# Patient Record
Sex: Female | Born: 1990 | Hispanic: Yes | Marital: Single | Smoking: Former smoker
Health system: Southern US, Community
[De-identification: ages and names within clinical notes are randomized; demographics above are authoritative.]

## PROBLEM LIST (undated history)

## (undated) ENCOUNTER — Inpatient Hospital Stay (HOSPITAL_COMMUNITY): Payer: Self-pay

## (undated) DIAGNOSIS — N200 Calculus of kidney: Secondary | ICD-10-CM

## (undated) DIAGNOSIS — O139 Gestational [pregnancy-induced] hypertension without significant proteinuria, unspecified trimester: Secondary | ICD-10-CM

## (undated) DIAGNOSIS — K219 Gastro-esophageal reflux disease without esophagitis: Secondary | ICD-10-CM

## (undated) DIAGNOSIS — O24419 Gestational diabetes mellitus in pregnancy, unspecified control: Secondary | ICD-10-CM

## (undated) DIAGNOSIS — D649 Anemia, unspecified: Secondary | ICD-10-CM

## (undated) HISTORY — DX: Gastro-esophageal reflux disease without esophagitis: K21.9

## (undated) HISTORY — DX: Gestational diabetes mellitus in pregnancy, unspecified control: O24.419

## (undated) HISTORY — PX: CHOLECYSTECTOMY: SHX55

## (undated) HISTORY — PX: KIDNEY STONE SURGERY: SHX686

---
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
## 2008-02-28 ENCOUNTER — Emergency Department (HOSPITAL_COMMUNITY): Admission: EM | Admit: 2008-02-28 | Discharge: 2008-02-28 | Payer: Self-pay | Admitting: Emergency Medicine

## 2008-08-10 ENCOUNTER — Emergency Department (HOSPITAL_COMMUNITY): Admission: EM | Admit: 2008-08-10 | Discharge: 2008-08-10 | Payer: Self-pay | Admitting: Emergency Medicine

## 2009-11-17 ENCOUNTER — Emergency Department (HOSPITAL_COMMUNITY): Admission: EM | Admit: 2009-11-17 | Discharge: 2009-11-17 | Payer: Self-pay | Admitting: Emergency Medicine

## 2010-07-26 ENCOUNTER — Encounter: Payer: Self-pay | Admitting: Obstetrics & Gynecology

## 2010-08-19 LAB — RUBELLA ANTIBODY, IGM: Rubella: IMMUNE

## 2010-08-19 LAB — ABO/RH

## 2010-08-19 LAB — HEPATITIS B SURFACE ANTIGEN: Hepatitis B Surface Ag: NEGATIVE

## 2010-08-19 LAB — HIV ANTIBODY (ROUTINE TESTING W REFLEX): HIV: NONREACTIVE

## 2010-10-19 LAB — COMPREHENSIVE METABOLIC PANEL
AST: 35 U/L (ref 0–37)
Alkaline Phosphatase: 116 U/L (ref 39–117)
CO2: 25 mEq/L (ref 19–32)
Calcium: 9 mg/dL (ref 8.4–10.5)
Chloride: 105 mEq/L (ref 96–112)
Creatinine, Ser: 0.52 mg/dL (ref 0.4–1.2)
GFR calc Af Amer: >60 mL/min (ref >60)
Sodium: 138 mEq/L (ref 135–145)
Total Bilirubin: 0.7 mg/dL (ref 0.3–1.2)

## 2010-10-19 LAB — CBC: MCV: 77.6 fL — ABNORMAL LOW (ref 78.0–100.0)

## 2010-10-19 LAB — URINALYSIS, ROUTINE W REFLEX MICROSCOPIC
Bilirubin Urine: NEGATIVE
Glucose, UA: NEGATIVE mg/dL
Ketones, ur: NEGATIVE mg/dL
Urobilinogen, UA: 0.2 mg/dL (ref 0.0–1.0)

## 2010-10-19 LAB — URINE MICROSCOPIC-ADD ON

## 2010-10-19 LAB — DIFFERENTIAL
Lymphocytes Relative: 25 % (ref 12–46)
Monocytes Relative: 7 % (ref 3–12)

## 2010-10-19 LAB — URINE CULTURE

## 2010-10-19 LAB — LIPASE, BLOOD: Lipase: 22 U/L (ref 11–59)

## 2010-11-16 NOTE — Discharge Summary (Signed)
Kara Booker, LIGHTLE              ACCOUNT NO.:  [id number]   MEDICAL RECORD NO.:  [id number]          PATIENT TYPE:  INP   LOCATION:  9122                          FACILITY:  [facility]   PHYSICIAN:  Charles A. Clearance Coots, M.D.DATE OF BIRTH:  09/14/1990   DATE OF ADMISSION:  10/22/2007  DATE OF DISCHARGE:  10/27/2007                               DISCHARGE SUMMARY   ADMITTING DIAGNOSES:  1. 39-weeks gestation.  2. Pregnancy-induced hypertension.  3. Active labor.   DISCHARGE DIAGNOSES:  1. 39-weeks gestation.  2. Pregnancy-induced hypertension.  3. Active labor.  4. Status post primary low-transverse cesarean section for arrest of      dilatation.  5. Active phase of labor.  6. Chorioamnionitis.   Viable female delivered on October 24, 2007.  Apgars of 9 at 1 minutes, 9 at  5 minutes, weight of 3695 grams, length of 52.71 cm.  Mother and infant  discharged home in good condition.   REASON FOR ADMISSION:  An 20 year old G2, P0 estimated date of  confinement of October 29, 2007, presents with mild pregnancy-induced  hypertension and active labor.   PAST MEDICAL HISTORY:   SURGERY:  None.   ILLNESSES:  Cholelithiasis.   MEDICATIONS:  Prenatal vitamins.   ALLERGIES:  No known drug allergies.   SOCIAL HISTORY:  Single.  Negative history of tobacco, alcohol, or  recreational drug use.   PHYSICAL EXAMINATION:  GENERAL:  Well-nourished, well-developed female  in no acute distress, and afebrile.  VITAL SIGNS:  Blood pressure 120-140/70-90.  LUNGS:  Clear to auscultation bilaterally.  HEART:  Regular rate and rhythm.  ABDOMEN:  Gravid and nontender.  CERVIX:  2 cm 60% vertex at a -3 stage.  LOWER EXTREMITIES:  With 4+ pretibial edema bilaterally.  Deep tendon  reflexes are 2+.   ADMITTING LABORATORY VALUES:  Hemoglobin 11.9, hematocrit 34.6, white  blood cell count 10,500, and platelets 241,000.  Comprehensive metabolic  panel was within normal limits.  RPR was nonreactive.   HOSPITAL COURSE:  The patient was admitted and membranes were ruptured,  clear fluid.  Pitocin augmentation was started.  An intrauterine  pressure catheter was inserted.  The patient progressed to anterior rim,  cervix was 80% effaced with the vertex was at -2 station and had  increased temperature of 201 with uterine tenderness.  She made no  further descent of the vertex greater than 3 hours and a decision was  made to proceed with the cesarean section delivery for arrest of  dilatation and descent and chorioamnionitis.  Primary low transverse  cesarean section was performed on October 24, 2007.  There were no  intraoperative complications.  Postoperative course was uncomplicated.  The patient was discharged home on postop day #3 in good condition.   DISCHARGE LABS:  Hemoglobin 10, hematocrit 30, white blood cell count  20,000, and platelets 185,000.   DISCHARGE DISPOSITION:   MEDICATIONS:  1. Tylox and ibuprofen was prescribed for pain.  2. Continue prenatal vitamins.   Routine written instructions were given for discharge after cesarean  section.  The patient is to call the office for a followup  appointment  in 2 weeks.      Charles A. Clearance Coots, M.D.  Electronically Signed     CAH/MEDQ  D:  10/27/2007  T:  10/27/2007  Job:  [id number]

## 2010-11-16 NOTE — H&P (Signed)
NAMEBLONDIE, Kara Booker              ACCOUNT NO.:  [id number]   MEDICAL RECORD NO.:  [id number]          PATIENT TYPE:  INP   LOCATION:  9169                          FACILITY:  [facility]   PHYSICIAN:  Roseanna Rainbow, M.D.DATE OF BIRTH:  Jul 12, 1990   DATE OF ADMISSION:  10/22/2007  DATE OF DISCHARGE:                              HISTORY & PHYSICAL   CHIEF COMPLAINT:  The patient is an 20 year old, gravida 2, para 0, with  estimated date of confinement of October 29, 2007, with an intrauterine  pregnancy at 39 weeks with mild PIH, for induction labor.   INTRAUTERINE PREGNANCY ILLNESS:  Please see the above.   ALLERGIES:  No known drug allergies.   MEDICATIONS:  Prenatal vitamins.   OBSTETRICAL RISK FACTORS:  Adolescence, please see the above.   PRENATAL LABORATORY DATA:  Chlamydia probe negative, urine culture and  sensitivity no significant growth, quad screen negative, GC probe  negative, one-hour GTT 106, hepatitis C surface antigen negative,  hematocrit 34.4, cystic fibrosis carrier testing negative, blood type is  O-positive, Rubella immune, sickle cell negative, GBS negative on March  25th.   PAST GYNECOLOGIC HISTORY:  Noncontributory.   PAST MEDICAL HISTORY:  Cholelithiasis.   SOCIAL HISTORY:  She is single, has no significant smoking history,  denies illicit drug use.   PAST OBSTETRICAL HISTORY:  History of a spontaneous abortion.   PHYSICAL EXAMINATION:  VITAL SIGNS:  Blood pressure is 120 to 140/70s to  90s, fetal heart tracing reassuring; tocodynamometer, regular uterine  contractions.  LOWER EXTREMITIES:  Pretibial edema at 4+ bilaterally.   ASSESSMENT:  1. A primigravida at term.  2. Mild pregnancy-induced hypertension.   PLAN:  1. Admission.  2. Induction of labor.      Roseanna Rainbow, M.D.  Electronically Signed     LAJ/MEDQ  D:  10/23/2007  T:  10/23/2007  Job:  [id number]

## 2010-11-16 NOTE — Op Note (Signed)
Kara Booker, Kara Booker              ACCOUNT NO.:  [id number]   MEDICAL RECORD NO.:  [id number]          PATIENT TYPE:  INP   LOCATION:  9169                          [facility]ACILITY:  [facility]   PHYSICIAN:  Roseanna Rainbow, M.D.DATE OF BIRTH:  Dec 04, 1990   DATE OF PROCEDURE:  10/24/2007  DATE OF DISCHARGE:                               OPERATIVE REPORT   PREOPERATIVE DIAGNOSES:  1. Arrest of dilatation, active phase of labor.  2. Chorioamnionitis.  3. Intrauterine pregnancy at term.  4. Mild pregnancy-induced hypertension.   POSTOPERATIVE DIAGNOSES:  1. Arrest of dilatation, active phase of labor.  2. Chorioamnionitis.  3. Intrauterine pregnancy at term.  4. Mild pregnancy-induced hypertension.  5. Persistent occiput posterior.   PROCEDURE:  Primary low uterine flap elliptical cesarean delivery via  Pfannenstiel skin incision.   SURGEONS:  Roseanna Rainbow, M.D., and Bing Neighbors. Clearance Coots, M.D.   ANESTHESIA:  Epidural.   ESTIMATED BLOOD LOSS:  600 mL.   COMPLICATIONS:  Mild uterine atony.   IV FLUIDS/URINE OUTPUT:  As per anesthesiology.   DESCRIPTION OF PROCEDURE:  The patient was taken to the operating room  with an IV running and an epidural catheter in situ.  She was then  placed in the dorsal supine position with a left lateral tilt and  prepped and draped in the usual sterile fashion.  After a timeout had  been completed, a Pfannenstiel skin incision was then made with a  scalpel and carried down to the underlying fascia.  The fascia was then  nicked in the midline.  The fascial incision was then extended  bilaterally.  The superior aspect of the fascial incision was tented up  and the underlying rectus muscles dissected off.  The inferior aspect of  the fascial incision was manipulated in a similar fashion.  The rectus  muscles were separated in the midline.  The parietal peritoneum was then  entered bluntly and the incision was extended bluntly.  An Alexis  retractor  was then placed into the incision.  The vesicouterine  peritoneum was tented up and entered sharply.  This incision was then  extended bilaterally and the bladder flap created bluntly.  The lower  uterine segment was then incised in the transverse fashion with the  scalpel.  This incision was then extended bluntly.  The infant's head  was delivered atraumatically.  The oropharynx was suctioned with bulb  suction.  The cord was clamped and cut.  The infant was handed off to  the awaiting neonatologist.  The placenta was then removed.  The  intrauterine cavity was evacuated of any remaining amniotic fluid,  clots, and debris with moist laparotomy sponge.  At this point, the  uterus was exteriorized.  The uterine fundus was felt to be boggy.  The  uterine tone responded to intramyometrial Pitocin as well as massage.  The uterine incision was then reapproximated in a running interlocking  fashion using 0-Monocryl.  A second imbricating layer of the same was  then placed.  Adequate hemostasis was noted.  The uterus was returned to  the abdomen.  The paracolic gutters were  irrigated.  The retractor was  then removed.  The parietal peritoneum was reapproximated in a running  fashion using 2-0 Vicryl.  The fascia was closed in a running fashion  using 0-PDS.  The skin was closed with staples.  At the close of the  procedure, the instrument and pack counts were said to be correct x2.  The patient was taken to the PACU, awake and in stable condition.      Roseanna Rainbow, M.D.  Electronically Signed     LAJ/MEDQ  D:  10/24/2007  T:  10/24/2007  Job:  [id number]

## 2010-11-19 NOTE — Discharge Summary (Signed)
Kara Booker, Kara Booker              ACCOUNT NO.:  [id number]   MEDICAL RECORD NO.:  [id number]          PATIENT TYPE:  OIB   LOCATION:  6119                         FACILITY:  [facility]   PHYSICIAN:  Leonia Corona, M.D.  DATE OF BIRTH:  09/23/1990   DATE OF ADMISSION:  10/03/2005  DATE OF DISCHARGE:  10/04/2005                                 DISCHARGE SUMMARY   HISTORY OF PRESENT ILLNESS:  A 20 year old Hispanic female admitted for pain  control after elective cholecystectomy for cholelithiasis, diagnosed on  September 14, 2005. The abdominal ultrasound for right upper quadrant pain at  the emergency department. The patient diet was advanced as tolerated and  pain controlled with Tylenol #3. The patient was ambulating and voiding at  the time of discharge. Bowel sounds were positive.   OPERATIONS/PROCEDURES/TREATMENT:  Cholecystectomy on October 03, 2005.   DIAGNOSIS:  Cholelithiasis, symptomatic.   MEDICATIONS:  Vicodin 5/500 mg 1 tab p.o. q.4 to 6 hours.   DISCHARGE WEIGHT:  186 pounds.   CONDITION ON DISCHARGE:  Stable.   SPECIAL INSTRUCTIONS:  The patient received a handout from Dr. Leeanne Mannan.   FOLLOW UP:  1.  The patient will followup with [facility] at 2:30 on October 11, 2005.  2.  The patient will followup with Dr. Leeanne Mannan on October 13, 2005 at 4:00      p.m.      Angeline Slim, M.D.      Leonia Corona, M.D.  Electronically Signed    AL/MEDQ  D:  10/04/2005  T:  10/05/2005  Job:  [id number]

## 2010-11-19 NOTE — Op Note (Signed)
NAMEMALARY, Kara Booker              ACCOUNT NO.:  [id number]   MEDICAL RECORD NO.:  [id number]          PATIENT TYPE:  OIB   LOCATION:  6119                         FACILITY:  [facility]   PHYSICIAN:  Leonia Corona, M.D.  DATE OF BIRTH:  03-Feb-1991   DATE OF PROCEDURE:  10/03/2005  DATE OF DISCHARGE:  10/04/2005                                 OPERATIVE REPORT   PREOPERATIVE DIAGNOSIS:  Cholelithiasis with a history of gallstone colic.   POSTOPERATIVE DIAGNOSIS:  Cholelithiasis with a history of gallstone colic.   PROCEDURE:  Laparoscopic cholecystectomy.   ANESTHESIA:  General endotracheal tube anesthesia.   SURGEON:  Leonia Corona, M.D.   ASSISTANTDonnella Bi D. Pendse, M.D.   INDICATIONS FOR PROCEDURE:  This 20 year old female child was evaluated for  right upper quadrant abdominal pain.  Clinically suspicion of gallstone  colic was made.  The presence of gallstones was confirmed on ultrasonogram  with a normal liver functions without any evidence of bilateral obstruction.  Hence the indication for the procedure.   DESCRIPTION OF PROCEDURE:  The patient was brought into the operating room  and placed supine on the operating table.  General endotracheal tube  anesthesia was given.  The abdomen was cleaned, prepped and draped from  nipple to the pubis and one side laterally to the other.  An infraumbilical  curvilinear skin crease incision was made and deepened through the  subcutaneous tissue until the rectus sheath was reached which was held  between two clamps and divided in between to gain access in the peritoneum.  Once the peritoneum was visualized and opened, a right index finger was  introduced to confirm access into the peritoneum.  The finger was swept  around to break any fatty adhesions.  A stay suture taking 0 Vicryl was  placed in U fashion.  The Hassan cannula 10/12 mm was introduced under  direct vision in the peritoneal cavity and connected to the CO2  insufflation  and the stay suture was tied around the [place] cannula to hold it in  position.  Desired pressure of pneumoperitoneum was obtained with CO2  insufflation.  The laparoscopic camera was introduced through the [facility]  cannula into the abdominal cavity and preliminary survey was done.  The  gallbladder was found to be clearly visible, however, appeared to be  intrahepatic in most parts.  The second 10/12 mm port was placed in the  subxiphoid area in the midline for which a transverse 1.5 cm incision was  made and the port was pushed through the abdominal wall under direct vision  of the camera from within the peritoneal cavity.  Third and fourth ports  were 5 mm which were placed in right upper quadrant in subcostal area 10 mm  apart from each other for which a small skin incision was made and the ports  were pushed with pressure under direct vision of the camera from within the  peritoneal cavity.  Two graspers were introduced through 5 mm ports, one  grasped the fundus of the gallbladder and pushed upwards and outwards to  lift the right lobe of the  liver and expose the gallbladder fossa.  The  second grasped the gallbladder near the infundibulum and retracted laterally  to open the Calot's' triangle.  The dissector was introduced through the  subxiphoid port and dissection was carried out.  There was a dense adhesion  of the fatty omentum over the gallbladder which was taken down carefully.  More vascular areas were cauterized while taking down the fatty adhesion.  Once the infundibulum and neck of the gallbladder was exposed, the  dissection was carried out to expose the cystic duct.  The stone was felt in  the infundibulum.  The cystic duct was carefully isolated on all sides by  blunt dissection with the dissector and then carefully dissecting in the  Calot's' triangle, there was a branch of the vessel was identified.  A very  small branch which was cauterized.  The main  cystic artery was then  carefully dissected and metal clips were applied, two proximal and one  distal, and then this vessel was divided in between.  The cystic duct, a 1  cm segment was already isolated.  Three clips were applied, two proximally  and one distally and then the cystic duct was divided in between the two.  Keeping a traction at the neck of the gallbladder using a spatula connected  to cautery, the gallbladder was carefully lifted off of its bed with the  cautery until the last bit of the attachment of the fundus of the  gallbladder remained with the liver.  This helped as a fulcrum to lift up  the liver  so that the  the gallbladder bed could be  inspected well. Few  small oozing spots were cauterized.  No active bleeding was noted.  The area  was irrigated with saline and suctioned out completely.  After completing  the inspection and showing no active bleeding anywhere in the area of  dissection, the last portion of the attachment of gallbladder to the liver  was divided with electrocautery.  The camera was then switched from the  umbilical port to the epigastric port and EndoCatch bag was introduced  through the umbilical port and gallbladder was fed into the EndoCatch bag  and removed along with the port.  The gallbladder was removed along with the  bag.  The Hassan cannula was reintroduced into the umbilical port and once  again, some irrigation was done and returning fluid was clear.  The stay  suture already placed in the umbilical port was then tied under direct  vision of the camera through the epigastric port ensuring complete fascial  closure of the umbilical incision.  At this point, both 5 mm ports were  removed under direct vision of the camera from within the peritoneal cavity  and finally, pneumoperitoneum was released and epigastric port was also  removed.  Approximately 20 mL of 0.25% Marcaine with epinephrine was infiltrated in and around all of the  incisions.  No fascial closure was done  in any other incision.  The skin was closed with skin staples.  The wound  was cleaned and dried.  A sterile gauze dressing was applied.  The patient  tolerated the procedure very well which was smooth and uneventful.  The  patient was later extubated and transported to the recovery room in good  stable condition.      Leonia Corona, M.D.  Electronically Signed     SF/MEDQ  D:  10/03/2005  T:  10/04/2005  Job:  [id number]   cc:  Claudia C. Lubertha South, M.D.  Fax: [phone]

## 2011-01-09 ENCOUNTER — Inpatient Hospital Stay (HOSPITAL_COMMUNITY): Admission: AD | Admit: 2011-01-09 | Payer: Medicaid Other | Source: Ambulatory Visit | Admitting: Obstetrics

## 2011-01-09 ENCOUNTER — Inpatient Hospital Stay (HOSPITAL_COMMUNITY)
Admission: AD | Admit: 2011-01-09 | Discharge: 2011-01-09 | Disposition: A | Discharge status: Home / Self Care | Payer: Medicaid Other | Source: Ambulatory Visit | Attending: Obstetrics | Admitting: Obstetrics

## 2011-01-09 ENCOUNTER — Encounter (HOSPITAL_COMMUNITY): Payer: Self-pay

## 2011-01-09 DIAGNOSIS — M549 Dorsalgia, unspecified: Secondary | ICD-10-CM | POA: Insufficient documentation

## 2011-01-09 DIAGNOSIS — R109 Unspecified abdominal pain: Secondary | ICD-10-CM | POA: Insufficient documentation

## 2011-01-09 HISTORY — DX: Calculus of kidney: N20.0

## 2011-01-09 LAB — URINALYSIS, ROUTINE W REFLEX MICROSCOPIC
Bilirubin Urine: NEGATIVE
Glucose, UA: NEGATIVE mg/dL
Ketones, ur: 15 mg/dL — AB
Nitrite: NEGATIVE
Protein, ur: NEGATIVE mg/dL
Specific Gravity, Urine: >1.03 — ABNORMAL HIGH (ref 1.005–1.030)

## 2011-01-09 LAB — URINE MICROSCOPIC-ADD ON

## 2011-01-09 NOTE — Progress Notes (Signed)
Patient reports having a lot of back pain and lower abdominal pain since yesterday, no vaginal bleeding, pt has history of preclampsia with previous pregnancy. G2P1 [facility] 01/12/11

## 2011-01-12 ENCOUNTER — Encounter (HOSPITAL_COMMUNITY): Payer: Self-pay | Admitting: *Deleted

## 2011-01-12 ENCOUNTER — Inpatient Hospital Stay (HOSPITAL_COMMUNITY)
Admission: AD | Admit: 2011-01-12 | Discharge: 2011-01-12 | Disposition: A | Discharge status: Home / Self Care | Payer: Medicaid Other | Source: Ambulatory Visit | Attending: Obstetrics | Admitting: Obstetrics

## 2011-01-12 DIAGNOSIS — O479 False labor, unspecified: Secondary | ICD-10-CM | POA: Insufficient documentation

## 2011-01-12 HISTORY — DX: Gestational (pregnancy-induced) hypertension without significant proteinuria, unspecified trimester: O13.9

## 2011-01-12 MED ORDER — OXYCODONE-ACETAMINOPHEN 5-325 MG PO TABS
2.0000 | ORAL_TABLET | Freq: Once | ORAL | Status: DC
  Filled 2011-01-12: qty 2

## 2011-01-12 NOTE — Initial Assessments (Signed)
Pt complaints of pain and pressure in the lower part of her abdomen"that I know are not contractions"

## 2011-01-12 NOTE — Progress Notes (Signed)
Pt states she was having regular contraction 7-10 then they stopped. This am started having constant pelvic pain and pressure. Pt reports good fetal movement, no leaking or bleeding.

## 2011-01-14 ENCOUNTER — Encounter (HOSPITAL_COMMUNITY): Payer: Self-pay | Admitting: Anesthesiology

## 2011-01-14 ENCOUNTER — Encounter (HOSPITAL_COMMUNITY): Payer: Self-pay | Admitting: *Deleted

## 2011-01-14 ENCOUNTER — Inpatient Hospital Stay (HOSPITAL_COMMUNITY): Payer: Medicaid Other | Admitting: Anesthesiology

## 2011-01-14 ENCOUNTER — Inpatient Hospital Stay (HOSPITAL_COMMUNITY)
Admission: AD | Admit: 2011-01-14 | Discharge: 2011-01-17 | DRG: 775 | Disposition: A | Discharge status: Home / Self Care | Payer: Medicaid Other | Source: Ambulatory Visit | Attending: Obstetrics | Admitting: Obstetrics

## 2011-01-14 DIAGNOSIS — R509 Fever, unspecified: Secondary | ICD-10-CM | POA: Diagnosis not present

## 2011-01-14 DIAGNOSIS — O34219 Maternal care for unspecified type scar from previous cesarean delivery: Secondary | ICD-10-CM | POA: Diagnosis present

## 2011-01-14 LAB — CBC
MCV: 78.5 fL (ref 78.0–100.0)
Platelets: 283 10*3/uL (ref 150–400)
RDW: 15.2 % (ref 11.5–15.5)
WBC: 12 10*3/uL — ABNORMAL HIGH (ref 4.0–10.5)

## 2011-01-14 LAB — RPR: RPR Ser Ql: NONREACTIVE

## 2011-01-14 MED ORDER — EPHEDRINE 5 MG/ML INJ
10.0000 mg | INTRAVENOUS | Status: DC | PRN
  Filled 2011-01-14: qty 4

## 2011-01-14 MED ORDER — OXYTOCIN 20 UNITS IN LACTATED RINGERS INFUSION - SIMPLE
125.0000 mL/h | Freq: Once | INTRAVENOUS | Status: DC
  Filled 2011-01-14: qty 1000

## 2011-01-14 MED ORDER — PHENYLEPHRINE 40 MCG/ML (10ML) SYRINGE FOR IV PUSH (FOR BLOOD PRESSURE SUPPORT)
80.0000 ug | PREFILLED_SYRINGE | INTRAVENOUS | Status: DC | PRN
  Filled 2011-01-14: qty 5

## 2011-01-14 MED ORDER — LACTATED RINGERS IV SOLN
INTRAVENOUS | Status: DC
  Administered 2011-01-14 – 2011-01-15 (×4): via INTRAVENOUS

## 2011-01-14 MED ORDER — NALBUPHINE SYRINGE 5 MG/0.5 ML
10.0000 mg | INJECTION | INTRAMUSCULAR | Status: DC
  Administered 2011-01-14: 10 mg via INTRAVENOUS
  Filled 2011-01-14 (×3): qty 1

## 2011-01-14 MED ORDER — FENTANYL 2.5 MCG/ML BUPIVACAINE 1/10 % EPIDURAL INFUSION (WH - ANES)
2.0000 mL/h | INTRAMUSCULAR | Status: DC
  Administered 2011-01-14 (×2): 8 mL/h via EPIDURAL
  Filled 2011-01-14: qty 60

## 2011-01-14 MED ORDER — OXYTOCIN 20 UNITS IN LACTATED RINGERS INFUSION - SIMPLE
6.0000 m[IU]/min | INTRAVENOUS | Status: DC
  Administered 2011-01-14: 6 m[IU]/min via INTRAVENOUS
  Administered 2011-01-14: 2 m[IU]/min via INTRAVENOUS

## 2011-01-14 MED ORDER — NALBUPHINE SYRINGE 5 MG/0.5 ML
10.0000 mg | INJECTION | INTRAMUSCULAR | Status: DC | PRN
  Administered 2011-01-14: 10 mg via INTRAVENOUS
  Filled 2011-01-14 (×2): qty 1

## 2011-01-14 MED ORDER — LACTATED RINGERS IV SOLN: 500.0000 mL | Freq: Once | INTRAVENOUS | Status: DC

## 2011-01-14 MED ORDER — FLEET ENEMA 7-19 GM/118ML RE ENEM: 1.0000 | ENEMA | RECTAL | Status: DC | PRN

## 2011-01-14 MED ORDER — IBUPROFEN 600 MG PO TABS: 600.0000 mg | ORAL_TABLET | Freq: Four times a day (QID) | ORAL | Status: DC | PRN

## 2011-01-14 MED ORDER — PHENYLEPHRINE 40 MCG/ML (10ML) SYRINGE FOR IV PUSH (FOR BLOOD PRESSURE SUPPORT): 80.0000 ug | PREFILLED_SYRINGE | INTRAVENOUS | Status: DC | PRN

## 2011-01-14 MED ORDER — EPHEDRINE 5 MG/ML INJ: 10.0000 mg | INTRAVENOUS | Status: DC | PRN

## 2011-01-14 MED ORDER — ACETAMINOPHEN 325 MG PO TABS
650.0000 mg | ORAL_TABLET | ORAL | Status: DC | PRN
  Administered 2011-01-15: 650 mg via ORAL
  Filled 2011-01-14: qty 2

## 2011-01-14 MED ORDER — LIDOCAINE HCL (PF) 1 % IJ SOLN
30.0000 mL | Freq: Once | INTRAMUSCULAR | Status: AC | PRN
  Filled 2011-01-14: qty 30

## 2011-01-14 MED ORDER — LACTATED RINGERS IV SOLN
500.0000 mL | Freq: Once | INTRAVENOUS | Status: AC
  Administered 2011-01-14: 500 mL via INTRAVENOUS

## 2011-01-14 MED ORDER — TERBUTALINE SULFATE 1 MG/ML IJ SOLN: 0.2500 mg | Freq: Once | INTRAMUSCULAR | Status: AC | PRN

## 2011-01-14 MED ORDER — DIPHENHYDRAMINE HCL 50 MG/ML IJ SOLN: 12.5000 mg | INTRAMUSCULAR | Status: DC | PRN

## 2011-01-14 MED ORDER — HYDROXYZINE HCL 50 MG/ML IM SOLN
50.0000 mg | Freq: Once | INTRAMUSCULAR | Status: AC
  Administered 2011-01-14: 50 mg via INTRAMUSCULAR
  Filled 2011-01-14: qty 1

## 2011-01-14 MED ORDER — CITRIC ACID-SODIUM CITRATE 334-500 MG/5ML PO SOLN: 30.0000 mL | ORAL | Status: DC | PRN

## 2011-01-14 MED ORDER — LIDOCAINE HCL 1.5 % IJ SOLN
INTRAMUSCULAR | Status: DC | PRN
  Administered 2011-01-14 (×2): 2 mL

## 2011-01-14 MED ORDER — NALBUPHINE SYRINGE 5 MG/0.5 ML
10.0000 mg | INJECTION | Freq: Once | INTRAMUSCULAR | Status: AC
  Administered 2011-01-14: 10 mg via INTRAMUSCULAR
  Filled 2011-01-14: qty 1

## 2011-01-14 MED ORDER — FENTANYL 2.5 MCG/ML BUPIVACAINE 1/10 % EPIDURAL INFUSION (WH - ANES)
2.0000 mL/h | INTRAMUSCULAR | Status: DC
  Filled 2011-01-14: qty 60

## 2011-01-14 MED ORDER — ONDANSETRON HCL 4 MG/2ML IJ SOLN: 4.0000 mg | Freq: Four times a day (QID) | INTRAMUSCULAR | Status: DC | PRN

## 2011-01-14 MED ORDER — OXYCODONE-ACETAMINOPHEN 5-325 MG PO TABS: 2.0000 | ORAL_TABLET | ORAL | Status: DC | PRN

## 2011-01-14 MED ORDER — LACTATED RINGERS IV SOLN
500.0000 mL | INTRAVENOUS | Status: AC | PRN
  Administered 2011-01-15: 1000 mL via INTRAVENOUS

## 2011-01-14 NOTE — Anesthesia Procedure Notes (Signed)
Epidural Patient location during procedure: OB Start time: 01/14/2011 6:27 PM  Staffing Anesthesiologist: Adelaida Reindel A. Performed by: anesthesiologist   Preanesthetic Checklist Completed: patient identified, site marked, surgical consent, pre-op evaluation, timeout performed, IV checked, risks and benefits discussed and monitors and equipment checked  Epidural Patient position: sitting Prep: site prepped and draped and DuraPrep Patient monitoring: continuous pulse ox and blood pressure Approach: midline Injection technique: LOR air  Needle Needle type: Tuohy  Needle gauge: 17 G Needle length: 9 cm Catheter type: closed end flexible Catheter size: 19 Gauge Test dose: negative and 1.5% lidocaine  Assessment Events: blood not aspirated, injection not painful, no injection resistance, negative IV test and no paresthesia  Additional Notes Patient informed of Wet Tap and possibility of PDPHA. Will start infusion at  Reduced rate.

## 2011-01-14 NOTE — Anesthesia Preprocedure Evaluation (Addendum)
Anesthesia Evaluation  Name, MR# and DOB Patient awake and Patient confused  General Assessment Comment  Reviewed: Allergy & Precautions, H&P  and Patient's Chart, lab work & pertinent test results  History of Anesthesia Complications (+) PONV  Airway Mallampati: III TM Distance: <3 FB Neck ROM: Full  Mouth opening: Limited Mouth Opening  Dental No notable dental hx (+) Teeth Intact   Pulmonaryneg pulmonary ROS    clear to auscultation  pulmonary exam normal   Cardiovascular Regular Normal   Neuro/PsychNegative Neurological ROS Negative Psych ROS  GI/Hepatic/Renal negative GI ROS, negative Liver ROS, and negative Renal ROS (+)       Endo/Other  Negative Endocrine ROS (+)   Abdominal Normal abdominal exam  (+)   Musculoskeletal negative musculoskeletal ROS (+)  Hematology negative hematology ROS (+)   Peds  Reproductive/Obstetrics (+) Pregnancy   Anesthesia Other Findings Probable Difficult Intubation            Anesthesia Physical Anesthesia Plan  ASA: III  Anesthesia Plan: Epidural   Post-op Pain Management:    Induction:   Airway Management Planned: Natural Airway  Additional Equipment:   Intra-op Plan:   Post-operative Plan:   Informed Consent: I have reviewed the patients History and Physical, chart, labs and discussed the procedure including the risks, benefits and alternatives for the proposed anesthesia with the patient or authorized representative who has indicated his/her understanding and acceptance.     Plan Discussed with: Anesthesiologist (AP)  Anesthesia Plan Comments: (Probable Difficult Intubation)       Anesthesia Quick Evaluation

## 2011-01-14 NOTE — Initial Assessments (Signed)
Pt armband verified with patient at the beginning of shift.

## 2011-01-14 NOTE — Progress Notes (Signed)
Pt reports contractions x 4 hours, some mucus discharge

## 2011-01-14 NOTE — Progress Notes (Signed)
Pt C/O uc's since 0200, some mucus discharge, no bleeding.

## 2011-01-14 NOTE — H&P (Signed)
Kara Booker is a 20 y.o. female presenting for uterine contractions.. Maternal Medical History:  Reason for admission: Reason for admission: contractions.  Contractions: Onset was 3-5 hours ago.   Frequency: regular.   Duration is approximately 60 seconds.   Perceived severity is strong.    Fetal activity: Perceived fetal activity is normal.   Last perceived fetal movement was within the past hour.    Prenatal complications: no prenatal complications   OB History    Grav Para Term Preterm Abortions TAB SAB Ect Mult Living   3 1 1  1  1   1      Past Medical History  Diagnosis Date  . Kidney stones   . Kidney stones   . Pregnancy induced hypertension    Past Surgical History  Procedure Date  . Kidney stone surgery   . Cesarean section    Family History: family history includes Cancer in her cousin; Diabetes in her maternal uncle and paternal uncle; and Hyperlipidemia in her father, maternal uncle, mother, and paternal uncle. Social History:  reports that she has never smoked. She does not have any smokeless tobacco history on file. She reports that she does not drink alcohol or use illicit drugs.  Review of Systems  Constitutional: Negative.   HENT: Negative.   Eyes: Negative.   Respiratory: Negative.   Cardiovascular: Negative.   Gastrointestinal: Negative.   Genitourinary: Negative.   Musculoskeletal: Negative.   Skin: Negative.   Neurological: Negative.   Endo/Heme/Allergies: Negative.   Psychiatric/Behavioral: Negative.     Dilation: 4 Effacement (%): 70 Station: -2 Exam by:: nurse Blood pressure 117/73, pulse 78, temperature 98.1 F (36.7 C), temperature source Oral, resp. rate 18, height 5\' 1"  (1.549 m), weight 102.513 kg (226 lb). Maternal Exam:  Uterine Assessment: Contraction strength is moderate.  Contraction duration is 60 seconds. Contraction frequency is regular.   Abdomen: Patient reports no abdominal tenderness. Surgical scars: low  transverse.   Fetal presentation: vertex  Introitus: Normal vulva. Normal vagina.  Ferning test: not done.  Nitrazine test: not done. Amniotic fluid character: not assessed.  Pelvis: adequate for delivery.   Cervix: Cervix evaluated by digital exam.     Physical Exam  Constitutional: She is oriented to person, place, and time. She appears well-developed and well-nourished.  HENT:  Head: Normocephalic and atraumatic.  Eyes: Conjunctivae and EOM are normal. Pupils are equal, round, and reactive to light.  Neck: Normal range of motion. Neck supple.  Cardiovascular: Normal rate and regular rhythm.   Respiratory: Effort normal and breath sounds normal.  GI: Soft.  Genitourinary: Vagina normal and uterus normal.  Musculoskeletal: Normal range of motion.  Neurological: She is alert and oriented to person, place, and time.  Skin: Skin is warm and dry.  Psychiatric: She has a normal mood and affect. Her behavior is normal. Judgment and thought content normal.    Prenatal labs: ABO, Rh:   Antibody: Negative (02/16 0000) Rubella:   RPR: Nonreactive (02/16 0000)  HBsAg: Negative (02/16 0000)  HIV: Non-reactive (02/16 0000)  GBS: Negative (06/07 0000)   Assessment/Plan: Term pregnancy.  Active labor.   Ebba Goll A 01/14/2011, 12:43 PM

## 2011-01-15 ENCOUNTER — Encounter (HOSPITAL_COMMUNITY): Payer: Self-pay

## 2011-01-15 DIAGNOSIS — R509 Fever, unspecified: Secondary | ICD-10-CM | POA: Diagnosis not present

## 2011-01-15 LAB — CBC
HCT: 31.7 % — ABNORMAL LOW (ref 36.0–46.0)
MCH: 25.9 pg — ABNORMAL LOW (ref 26.0–34.0)
MCV: 78.3 fL (ref 78.0–100.0)
Platelets: 269 10*3/uL (ref 150–400)
RBC: 4.05 MIL/uL (ref 3.87–5.11)

## 2011-01-15 MED ORDER — OXYCODONE-ACETAMINOPHEN 5-325 MG PO TABS
1.0000 | ORAL_TABLET | ORAL | Status: DC | PRN
  Administered 2011-01-15 – 2011-01-17 (×7): 2 via ORAL
  Filled 2011-01-15: qty 2
  Filled 2011-01-15: qty 1
  Filled 2011-01-15 (×4): qty 2
  Filled 2011-01-15: qty 1
  Filled 2011-01-15: qty 2

## 2011-01-15 MED ORDER — LANOLIN HYDROUS EX OINT: TOPICAL_OINTMENT | CUTANEOUS | Status: DC | PRN

## 2011-01-15 MED ORDER — METHYLERGONOVINE MALEATE 0.2 MG PO TABS: 0.2000 mg | ORAL_TABLET | ORAL | Status: DC | PRN

## 2011-01-15 MED ORDER — AMPICILLIN 500 MG PO CAPS
500.0000 mg | ORAL_CAPSULE | Freq: Four times a day (QID) | ORAL | Status: DC
  Administered 2011-01-15 – 2011-01-17 (×10): 500 mg via ORAL
  Filled 2011-01-15 (×14): qty 1

## 2011-01-15 MED ORDER — RHO D IMMUNE GLOBULIN 1500 UNIT/2ML IJ SOLN: 300.0000 ug | Freq: Once | INTRAMUSCULAR | Status: DC

## 2011-01-15 MED ORDER — SODIUM CHLORIDE 0.9 % IV SOLN: 250.0000 mL | INTRAVENOUS | Status: DC

## 2011-01-15 MED ORDER — SODIUM CHLORIDE 0.9 % IV SOLN
2.0000 g | Freq: Four times a day (QID) | INTRAVENOUS | Status: DC
  Filled 2011-01-15: qty 2000

## 2011-01-15 MED ORDER — SODIUM CHLORIDE 0.9 % IJ SOLN
3.0000 mL | Freq: Two times a day (BID) | INTRAMUSCULAR | Status: DC
  Administered 2011-01-15 (×2): 3 mL via INTRAVENOUS

## 2011-01-15 MED ORDER — TETANUS-DIPHTH-ACELL PERTUSSIS 5-2.5-18.5 LF-MCG/0.5 IM SUSP
0.5000 mL | Freq: Once | INTRAMUSCULAR | Status: AC
  Administered 2011-01-17: 0.5 mL via INTRAMUSCULAR
  Filled 2011-01-15: qty 0.5

## 2011-01-15 MED ORDER — IBUPROFEN 600 MG PO TABS
600.0000 mg | ORAL_TABLET | Freq: Four times a day (QID) | ORAL | Status: DC
  Administered 2011-01-15 – 2011-01-17 (×10): 600 mg via ORAL
  Filled 2011-01-15 (×10): qty 1

## 2011-01-15 MED ORDER — FERROUS SULFATE 325 (65 FE) MG PO TABS
325.0000 mg | ORAL_TABLET | Freq: Two times a day (BID) | ORAL | Status: DC
  Administered 2011-01-15 – 2011-01-17 (×4): 325 mg via ORAL
  Filled 2011-01-15 (×4): qty 1

## 2011-01-15 MED ORDER — BENZOCAINE-MENTHOL 20-0.5 % EX AERO
INHALATION_SPRAY | CUTANEOUS | Status: AC
  Filled 2011-01-15: qty 56

## 2011-01-15 MED ORDER — ONDANSETRON HCL 4 MG PO TABS: 4.0000 mg | ORAL_TABLET | ORAL | Status: DC | PRN

## 2011-01-15 MED ORDER — BENZOCAINE-MENTHOL 20-0.5 % EX AERO
1.0000 "application " | INHALATION_SPRAY | CUTANEOUS | Status: DC | PRN
  Administered 2011-01-15: 06:00:00 via TOPICAL

## 2011-01-15 MED ORDER — DIPHENHYDRAMINE HCL 25 MG PO CAPS: 25.0000 mg | ORAL_CAPSULE | Freq: Four times a day (QID) | ORAL | Status: DC | PRN

## 2011-01-15 MED ORDER — SODIUM CHLORIDE 0.9 % IJ SOLN: 3.0000 mL | INTRAMUSCULAR | Status: DC | PRN

## 2011-01-15 MED ORDER — WITCH HAZEL-GLYCERIN EX PADS: MEDICATED_PAD | CUTANEOUS | Status: DC | PRN

## 2011-01-15 MED ORDER — OXYTOCIN 20 UNITS IN LACTATED RINGERS INFUSION - SIMPLE: 125.0000 mL/h | INTRAVENOUS | Status: DC | PRN

## 2011-01-15 MED ORDER — AMPICILLIN 500 MG PO CAPS
500.0000 mg | ORAL_CAPSULE | Freq: Four times a day (QID) | ORAL | Status: DC
  Administered 2011-01-15: 500 mg via ORAL
  Filled 2011-01-15 (×3): qty 1

## 2011-01-15 MED ORDER — SIMETHICONE 80 MG PO CHEW: 80.0000 mg | CHEWABLE_TABLET | ORAL | Status: DC | PRN

## 2011-01-15 MED ORDER — SENNOSIDES-DOCUSATE SODIUM 8.6-50 MG PO TABS
1.0000 | ORAL_TABLET | Freq: Every day | ORAL | Status: DC
  Administered 2011-01-15 – 2011-01-16 (×2): 2 via ORAL

## 2011-01-15 MED ORDER — PRENATAL PLUS 27-1 MG PO TABS
1.0000 | ORAL_TABLET | Freq: Every day | ORAL | Status: DC
  Administered 2011-01-15 – 2011-01-17 (×3): 1 via ORAL
  Filled 2011-01-15 (×3): qty 1

## 2011-01-15 MED ORDER — ZOLPIDEM TARTRATE 5 MG PO TABS: 5.0000 mg | ORAL_TABLET | Freq: Every evening | ORAL | Status: DC | PRN

## 2011-01-15 MED ORDER — ONDANSETRON HCL 4 MG/2ML IJ SOLN: 4.0000 mg | INTRAMUSCULAR | Status: DC | PRN

## 2011-01-15 NOTE — Progress Notes (Signed)
BABY HAS NOT FED SINCE INITIAL FEEDING AFTER BIRTH.  WAKING TECHNIQUES DONE AND BABY PLACED SKIN TO SKIN.  NO FEEDING CUES FROM BABY.  INSTRUCTED MOTHER TO KEEP BABY SKIN TO SKIN, WATCH FOR FEEDING CUES AND ATTEMPT FEEDING EVERY HOUR UNTIL FEEDING WELL.  BREASTFEEDING CONSULTATION SERVICES INFORMATION GIVEN TO PT.  ENCOURAGED TO CALL WITH QUESTIONS AND CONCERNS.

## 2011-01-16 NOTE — Procedures (Signed)
Epidural Blood Patch Procedure  Pre-operative diagnosis: Post dural puncture-type headache  Post-operative diagnosis: Same  Procedure:  1)  Epidural blood patch at the L3-L4 level 2) Blood drawn from Right Antecubital Vein after prepping skin with DuraPrep  After risks and benefits were explained including bleeding, infection, worsening of the pain, damage to the area being injected, nerve damage, spinal cord damage, paralysis, meningitis, arachnoiditis, allergic reaction to medication and spinal headache, all questions were answered and a signed consent was obtained.    The patient was placed in the sitting position. The existing epidural catheter was used for injection of the blood. 20ml of whole blood was withdrawn from the Right AC vein using sterile technique. The blood was then injected via the existing epidural catheter. She reported discomfort on the left lower back and left hip with injection at 10ml. She reported relief of her neck pain and headache.  After the blood was injected the epidural catheter was removed and a sterile dressing was place over the area. She was then placed in the right lateral decubitus position. She tolerated the procedure well.   She was instructed to return to [facility] if her headache recurs, if she develops a fever, severe back pain or has any weakness or numbness of her lower extremities.

## 2011-01-16 NOTE — Progress Notes (Signed)
  Postpartum day one Vital signs normal Fundus firm Lochia moderate Legs negative No complaints

## 2011-01-16 NOTE — Progress Notes (Signed)
20 y/o H female who delivered early yesterday am. She had a lumbar epidural catheter place for analgesia for L & D. During insertion of the epidural a "wet tap" was obtained. The epidural catheter was left in place after she delivered. She has developed a headache and neck stiffness which is positional in nature somewhat relieved by supine position. I have discussed  With her the options of treatment of her PDPHA. At this time she wishes to proceed with a Lumbar Epidural Blood Patch via her existing Epidural catheter.

## 2011-01-16 NOTE — Progress Notes (Signed)
PATIENT STATES SHE GAVE FORMULA DURING NIGHT DUE TO DIFFICULT LATCH ON LEFT BREAST.  BABY JUST FED ON RIGHT BREAST AND BABY NOT INTERESTED IN FEEDING NOW.  DEBP SET UP AND INITIATED WITH COLOSTRUM EASILY EXPRESSED.  RN TO ASSIST WITH DROPPER FEEDING WHEN BABY READY.  ENCOURAGED TO CALL WITH QUESTIONS AND CONCERNS.

## 2011-01-17 ENCOUNTER — Encounter (HOSPITAL_COMMUNITY): Payer: Self-pay | Admitting: Obstetrics

## 2011-01-17 ENCOUNTER — Encounter (HOSPITAL_COMMUNITY): Payer: Medicaid Other

## 2011-01-17 MED ORDER — BENZOCAINE-MENTHOL 20-0.5 % EX AERO: 1.0000 "application " | INHALATION_SPRAY | CUTANEOUS | Status: DC | PRN

## 2011-01-17 MED ORDER — FERROUS SULFATE 325 (65 FE) MG PO TABS: 325.0000 mg | ORAL_TABLET | Freq: Two times a day (BID) | ORAL | Status: DC

## 2011-01-17 MED ORDER — ACETAMINOPHEN-CODEINE 300-30 MG PO TABS: 1.0000 | ORAL_TABLET | ORAL | Status: AC | PRN

## 2011-01-17 NOTE — Progress Notes (Signed)
Mother giving lots of bottles , encouraged to freq feed infant at breast every 2-3 hours. inst to maintain good deep latch with each feeding.

## 2011-01-17 NOTE — Discharge Summary (Signed)
Obstetric Discharge Summary Reason for Admission: onset of labor Prenatal Procedures: none Intrapartum Procedures: spontaneous vaginal delivery Postpartum Procedures: none Complications-Operative and Postpartum: none  Hemoglobin  Date Value Range Status  01/15/2011 10.5* 12.0-15.0 (g/dL) Final     HCT  Date Value Range Status  01/15/2011 31.7* 36.0-46.0 (%) Final    Discharge Diagnoses: Term Pregnancy-delivered  Discharge Information: Date: 01/17/2011 Activity: pelvic rest Diet: routine Medications: Tylenol #3 and Iron Condition: stable Instructions: refer to practice specific booklet Discharge to: home Follow-up Information    Follow up with MARSHALL,BERNARD A. Call in 6 weeks.   Contact information:   [address] [place] [address] [phone]          Newborn Data: Live born  Information for the patient's newborn:  Amaranta, Mehl [[id number]]  female ; ;  Home with mother.  MARSHALL,BERNARD A 01/17/2011, 3:30 AM

## 2011-01-17 NOTE — Progress Notes (Signed)
  Postpartum day #2 Vital signs normal Fundus firm Legs negative No complaints Patient to be discharged today on Tylenol No. 3 for pain

## 2011-01-18 NOTE — Anesthesia Postprocedure Evaluation (Signed)
  Anesthesia Post-op Note  Patient: Kara Booker  Procedure(s) Performed: * No procedures listed *  Patient Location: [facility] Room 141  Anesthesia Type: Epidural  Level of Consciousness: awake, alert  and oriented  Airway and Oxygen Therapy: Patient Spontanous Breathing  Post-op Pain: mild  Post-op Assessment: Post-op Vital signs reviewed, Patient's Cardiovascular Status Stable, Respiratory Function Stable, Patent Airway, No signs of Nausea or vomiting, Adequate PO intake, Pain level controlled, No residual numbness and No residual motor weakness  Post-op Vital Signs: Reviewed and stable  Complications: postural headache

## 2011-03-29 LAB — URINALYSIS, ROUTINE W REFLEX MICROSCOPIC
Glucose, UA: NEGATIVE
Leukocytes, UA: NEGATIVE
Protein, ur: 100 — AB
Specific Gravity, Urine: 1.025
pH: 6.5

## 2011-03-29 LAB — COMPREHENSIVE METABOLIC PANEL
ALT: 25
ALT: 22
AST: 37
Alkaline Phosphatase: 180 — ABNORMAL HIGH
Alkaline Phosphatase: 187 — ABNORMAL HIGH
BUN: 12
CO2: 21
CO2: 21
Calcium: 8.8
GFR calc Af Amer: >60
GFR calc non Af Amer: >60
GFR calc non Af Amer: >60
Glucose, Bld: 86
Potassium: 3.9
Potassium: 4.3
Sodium: 135
Sodium: 135
Total Protein: 6.3

## 2011-03-29 LAB — CBC
HCT: 34.6 — ABNORMAL LOW
HCT: 35.6 — ABNORMAL LOW
Hemoglobin: 12.2
Hemoglobin: 12.3
MCHC: 34.5
MCHC: 35.2
MCHC: 35.2
MCV: 79.4
Platelets: 240
Platelets: 241
RBC: 3.87
RBC: 4.4
RDW: 14.5
RDW: 15.4
RDW: 15.6 — ABNORMAL HIGH
RDW: 15.8 — ABNORMAL HIGH
WBC: 11.9 — ABNORMAL HIGH
WBC: 10.5
WBC: 10.5

## 2011-03-29 LAB — RPR: RPR Ser Ql: NONREACTIVE

## 2011-03-29 LAB — CREATININE CLEARANCE, URINE, 24 HOUR
Collection Interval-CRCL: 24
Creatinine Clearance: 111
Creatinine, 24H Ur: 812
Creatinine, Urine: 85.5
Creatinine: 0.51

## 2011-03-29 LAB — URINE MICROSCOPIC-ADD ON

## 2011-03-29 LAB — URIC ACID: Uric Acid, Serum: 5.4

## 2011-03-29 LAB — LACTATE DEHYDROGENASE: LDH: 159

## 2011-04-12 LAB — URINALYSIS, ROUTINE W REFLEX MICROSCOPIC
Bilirubin Urine: NEGATIVE
Glucose, UA: NEGATIVE
Ketones, ur: NEGATIVE
Protein, ur: NEGATIVE
Protein, ur: NEGATIVE
Specific Gravity, Urine: 1.025
Urobilinogen, UA: 0.2

## 2011-04-12 LAB — URINE MICROSCOPIC-ADD ON

## 2012-10-13 ENCOUNTER — Encounter (HOSPITAL_COMMUNITY): Payer: Self-pay | Admitting: *Deleted

## 2012-10-13 DIAGNOSIS — Z8742 Personal history of other diseases of the female genital tract: Secondary | ICD-10-CM | POA: Insufficient documentation

## 2012-10-13 DIAGNOSIS — F17200 Nicotine dependence, unspecified, uncomplicated: Secondary | ICD-10-CM | POA: Insufficient documentation

## 2012-10-13 DIAGNOSIS — Z87440 Personal history of urinary (tract) infections: Secondary | ICD-10-CM | POA: Insufficient documentation

## 2012-10-13 DIAGNOSIS — L02419 Cutaneous abscess of limb, unspecified: Secondary | ICD-10-CM | POA: Insufficient documentation

## 2012-10-13 DIAGNOSIS — L02219 Cutaneous abscess of trunk, unspecified: Secondary | ICD-10-CM | POA: Insufficient documentation

## 2012-10-13 NOTE — ED Notes (Signed)
Pt c/o boil to inner left thigh since Thursday, states it is not draining. Painful to walk.  Denies fevers/chills, n/v.

## 2012-10-14 ENCOUNTER — Emergency Department (HOSPITAL_COMMUNITY)
Admission: EM | Admit: 2012-10-14 | Discharge: 2012-10-14 | Disposition: A | Discharge status: Home / Self Care | Payer: Self-pay | Attending: Emergency Medicine | Admitting: Emergency Medicine

## 2012-10-14 DIAGNOSIS — L03119 Cellulitis of unspecified part of limb: Secondary | ICD-10-CM

## 2012-10-14 MED ORDER — HYDROCODONE-ACETAMINOPHEN 5-325 MG PO TABS: 1.0000 | ORAL_TABLET | ORAL | Status: DC | PRN

## 2012-10-14 MED ORDER — DOXYCYCLINE HYCLATE 100 MG PO CAPS: 100.0000 mg | ORAL_CAPSULE | Freq: Two times a day (BID) | ORAL | Status: DC

## 2012-10-14 NOTE — ED Provider Notes (Signed)
History     CSN: [id number]  Arrival date & time 10/13/12  2334   First MD Initiated Contact with Patient 10/14/12 0007      Chief Complaint  Patient presents with  . Abscess   HPI  History provided by the patient. Patient is a 22 year old female who presents with concerns of skin redness, pain and swelling. Patient first noticed a small area of redness and swelling to her left proximal thigh and groin area 2 days ago. She began using warm compresses over the area and was told by friends this was a boil. Since that time areas become more painful and sore. There has been no bleeding or drainage from the area. No erythematous streaks up the leg or body. No fever, chills or sweats. No other aggravating or alleviating factors. Denies similar symptoms previously.      Past Medical History  Diagnosis Date  . Kidney stones   . Kidney stones   . Pregnancy induced hypertension   . NVD (normal vaginal delivery) 01/17/2011    Past Surgical History  Procedure Laterality Date  . Kidney stone surgery    . Cesarean section      Family History  Problem Relation Age of Onset  . Hyperlipidemia Mother   . Hyperlipidemia Father   . Hyperlipidemia Maternal Uncle   . Diabetes Maternal Uncle   . Hyperlipidemia Paternal Uncle   . Diabetes Paternal Uncle   . Cancer Cousin     History  Substance Use Topics  . Smoking status: Current Every Day Smoker -- 0.10 packs/day  . Smokeless tobacco: Not on file  . Alcohol Use: Yes     Comment: occ    OB History   Grav Para Term Preterm Abortions TAB SAB Ect Mult Living   3 2 2  1  1   2       Review of Systems  Constitutional: Negative for fever, chills and diaphoresis.  All other systems reviewed and are negative.    Allergies  Review of patient's allergies indicates no known allergies.  Home Medications   Current Outpatient Rx  Name  Route  Sig  Dispense  Refill  . OVER THE COUNTER MEDICATION   Oral   Take 1 tablet by mouth  daily. O[facility]           BP 119/67  Pulse 97  Temp(Src) 98.2 F (36.8 C) (Oral)  Resp 16  SpO2 98%  Physical Exam  Nursing note and vitals reviewed. Constitutional: She is oriented to person, place, and time. She appears well-developed and well-nourished. No distress.  HENT:  Head: Normocephalic.  Cardiovascular: Normal rate and regular rhythm.   Pulmonary/Chest: Effort normal and breath sounds normal. No respiratory distress. She has no wheezes. She has no rales.  Abdominal: Soft.  Musculoskeletal: Normal range of motion.  Neurological: She is alert and oriented to person, place, and time.  Skin: Skin is warm and dry. No rash noted. There is erythema.  6 cm area of erythema with slight induration to the medial left proximal thigh and groin. There is a small central or slightly fluctuant nodular area approximately 2 cm in length.  Psychiatric: She has a normal mood and affect. Her behavior is normal.    ED Course  Procedures   INCISION AND DRAINAGE Performed by: Angus Seller Consent: Verbal consent obtained. Risks and benefits: risks, benefits and alternatives were discussed Type: abscess  Body area: Left proximal thigh and groin  Anesthesia: local  infiltration  Incision was made with a scalpel.  Local anesthetic: lidocaine 2% with epinephrine  Anesthetic total: 1 ml  Complexity: complex Blunt dissection to break up loculations  Drainage: purulent  Drainage amount: Small   Packing material: None   Patient tolerance: Patient tolerated the procedure well with no immediate complications.       1. Cellulitis and abscess of leg       MDM  12:15 AM patient seen and evaluated. Patient appears well in no acute distress. She does not appear severely ill or toxic.        Angus Seller, PA-C 10/14/12 [phone]

## 2012-10-14 NOTE — ED Provider Notes (Signed)
Medical screening examination/treatment/procedure(s) were performed by non-physician practitioner and as supervising physician I was immediately available for consultation/collaboration.  Sunnie Nielsen, MD 10/14/12 [phone]

## 2013-03-27 ENCOUNTER — Encounter: Payer: Self-pay | Admitting: Gynecology

## 2013-03-27 ENCOUNTER — Ambulatory Visit (INDEPENDENT_AMBULATORY_CARE_PROVIDER_SITE_OTHER): Payer: Self-pay | Admitting: Gynecology

## 2013-03-27 ENCOUNTER — Other Ambulatory Visit (HOSPITAL_COMMUNITY)
Admission: RE | Admit: 2013-03-27 | Discharge: 2013-03-27 | Disposition: A | Discharge status: Home / Self Care | Payer: Medicaid Other | Source: Ambulatory Visit | Attending: Gynecology | Admitting: Gynecology

## 2013-03-27 VITALS — BP 126/86 | Ht 60.75 in | Wt 218.0 lb

## 2013-03-27 DIAGNOSIS — Z01419 Encounter for gynecological examination (general) (routine) without abnormal findings: Secondary | ICD-10-CM | POA: Insufficient documentation

## 2013-03-27 DIAGNOSIS — L709 Acne, unspecified: Secondary | ICD-10-CM | POA: Insufficient documentation

## 2013-03-27 DIAGNOSIS — Z309 Encounter for contraceptive management, unspecified: Secondary | ICD-10-CM

## 2013-03-27 DIAGNOSIS — N915 Oligomenorrhea, unspecified: Secondary | ICD-10-CM

## 2013-03-27 DIAGNOSIS — Z8632 Personal history of gestational diabetes: Secondary | ICD-10-CM | POA: Insufficient documentation

## 2013-03-27 DIAGNOSIS — L708 Other acne: Secondary | ICD-10-CM

## 2013-03-27 DIAGNOSIS — E663 Overweight: Secondary | ICD-10-CM

## 2013-03-27 LAB — CBC WITH DIFFERENTIAL/PLATELET
Basophils Absolute: 0 10*3/uL (ref 0.0–0.1)
Eosinophils Absolute: 0.3 10*3/uL (ref 0.0–0.7)
Eosinophils Relative: 3 % (ref 0–5)
MCH: 26 pg (ref 26.0–34.0)
MCHC: 33.4 g/dL (ref 30.0–36.0)
MCV: 77.7 fL — ABNORMAL LOW (ref 78.0–100.0)
Platelets: 438 10*3/uL — ABNORMAL HIGH (ref 150–400)
RDW: 13.9 % (ref 11.5–15.5)

## 2013-03-27 LAB — PREGNANCY, URINE: Preg Test, Ur: NEGATIVE

## 2013-03-27 MED ORDER — NORETHIN ACE-ETH ESTRAD-FE 1-20 MG-MCG(24) PO TABS: 1.0000 | ORAL_TABLET | Freq: Every day | ORAL | Status: DC

## 2013-03-27 MED ORDER — MEDROXYPROGESTERONE ACETATE 10 MG PO TABS: 10.0000 mg | ORAL_TABLET | Freq: Every day | ORAL | Status: DC

## 2013-03-27 NOTE — Progress Notes (Signed)
Kara Booker [birth year] [id number]   History:    22 y.o.  for annual gyn exam new patient to the practice. The patient complaining of cycles being very irregular whereby sometimes she would go as much as 5 months without a menstrual cycle. She has been using condoms for contraception. She is a gravida 3 para 2 Ab1 (spontaneous miscarriage). Her first pregnancy was delivered via cesarean section the second one was a vaginal birth after cesarean section. Patient stated her last Pap smear was in 2012 and that she has always had normal Pap smears. Her last full gynecological exam at another medical facility was over 2 years ago. Patient denies any visual disturbances, headaches, or nipple discharge. Her last menstrual period was August 9 through the 16th. Patient's second pregnancy resulted in gestational diabetes and patient's mother is non-insulin-dependent diabetic.  Past medical history,surgical history, family history and social history were all reviewed and documented in the EPIC chart.  Gynecologic History Patient's last menstrual period was 02/09/2013. Contraception: condoms Last Pap: 2012. Results were: normal Last mammogram: not indicated. Results were: not indicated  Obstetric History OB History  Gravida Para Term Preterm AB SAB TAB Ectopic Multiple Living  3 2 2  1 1    2     # Outcome Date GA Lbr Len/2nd Weight Sex Delivery Anes PTL Lv  3 TRM 01/15/11 [redacted]w[redacted]d 19:08 / 03:52 7 lb 15.3 oz (3.609 kg) M VBAC EPI  Y     Comments: None  2 SAB           1 TRM     M LTCS   Y     Comments: failed induction due to HTN       ROS: A ROS was performed and pertinent positives and negatives are included in the history.  GENERAL: No fevers or chills. HEENT: No change in vision, no earache, sore throat or sinus congestion. NECK: No pain or stiffness. CARDIOVASCULAR: No chest pain or pressure. No palpitations. PULMONARY: No shortness of breath, cough or wheeze. GASTROINTESTINAL: No abdominal pain,  nausea, vomiting or diarrhea, melena or bright red blood per rectum. GENITOURINARY: No urinary frequency, urgency, hesitancy or dysuria. MUSCULOSKELETAL: No joint or muscle pain, no back pain, no recent trauma. DERMATOLOGIC: No rash, no itching, no lesions. ENDOCRINE: No polyuria, polydipsia, no heat or cold intolerance. No recent change in weight. HEMATOLOGICAL: No anemia or easy bruising or bleeding. NEUROLOGIC: No headache, seizures, numbness, tingling or weakness. PSYCHIATRIC: No depression, no loss of interest in normal activity or change in sleep pattern.     Exam: chaperone present  BP 126/86  Ht 5' 0.75" (1.543 m)  Wt 218 lb (98.884 kg)  BMI 41.53 kg/m2  LMP 02/09/2013  Body mass index is 41.53 kg/(m^2).  General appearance : Well developed well nourished female. No acute distress HEENT: Neck supple, trachea midline, no carotid bruits, no thyroidmegaly Lungs: Clear to auscultation, no rhonchi or wheezes, or rib retractions  Heart: Regular rate and rhythm, no murmurs or gallops Breast:Examined in sitting and supine position were symmetrical in appearance, no palpable masses or tenderness,  no skin retraction, no nipple inversion, no nipple discharge, no skin discoloration, no axillary or supraclavicular lymphadenopathy Abdomen: no palpable masses or tenderness, no rebound or guarding Extremities: no edema or skin discoloration or tenderness  Pelvic:  Bartholin, Urethra, Skene Glands: Within normal limits             Vagina: No gross lesions or discharge  Cervix: No gross lesions  or discharge  Uterus  anteverted, normal size, shape and consistency, non-tender and mobile  Adnexa  Without masses or tenderness  Anus and perineum  normal   Rectovaginal  normal sphincter tone without palpated masses or tenderness             Hemoccult that indicated     Assessment/Plan:  22 y.o. female for annual exam with history of oligomenorrhea. Patient is overweight with a BMI of 41.53 kg per  meter squared. We discussed possible etiologies for her oligomenorrhea to include obesity, possible PCO S., possible thyroid or pituitary dysfunction. The following labs ordered today: Urine pregnancy test today negative. Tests ordered: TSH, prolactin, CBC, urinalysis, hemoglobin A1c, screening cholesterol and Pap smear. Patient also suffers from acne as well and which she does have her cycles are very heavy. We discussed starting oral contraceptive pill at low dose. Patient denies any family history of any bleeding or clotting disorders. The patient is a nonsmoker. We discussed importance of regular exercise and proper nutrition for which handout was provided. She will take Provera 10 mg for the next 5-10 days to initiate her menses and then on the second day she was started on the oral contraceptive pill LoMedia. The risks benefits and pros and cons to include potential risk of DVT and pulmonary embolism from oral contraceptive pill usage was provided. Ligature information on all the above was provided. Patient was reminded also to do her monthly breast exams.    Ok Edwards MD, 1:44 PM 03/27/2013

## 2013-03-27 NOTE — Patient Instructions (Addendum)
Uso de los anticonceptivos orales  (Oral Contraception Use) Los anticonceptivos orales son medicamentos que se utilizan para evitar el embarazo. Su funcin es evitar que los ovarios liberen vulos. Las hormonas de los anticonceptivos orales hacen que el moco cervical se haga ms espeso, lo que evita que el esperma ingrese al tero. Tambin hacen que la membrana que tapiza el tero se vuelva ms fina, lo que no permite que el huevo fertilizado se adhiera a la pared del tero. Los anticonceptivos orales son muy efectivos cuando se toman exactamente como se prescriben. Sin embargo, los anticonceptivos orales no previenen contra las enfermedades de transmisin sexual (ETS). La prctica del sexo seguro, como el uso de preservativos, junto con los anticonceptivos orales, ayudan a prevenir ese tipo de enfermedades. Antes de tomar la pldora, usted debe hacerse un examen fsico y un [place]. El mdico podr indicarle anlisis de sangre, si es necesario. El mdico se asegurar de que usted es una buena candidata para usar anticonceptivos orales. Converse con su mdico acerca de los posibles efectos secundarios de los anticonceptivos orales. Cuando se inicia el uso de anticonceptivos orales, se pueden tomar durante 2 a 3 meses para que el cuerpo se adapte a los cambios en los niveles hormonales en el cuerpo.  CMO TOMAR LOS ANTICONCEPTIVOS ORALES  El mdico le indicar como comenzar a tomar el primer ciclo de anticonceptivos orales. De lo contrario usted puede:   Comenzar el 1er. da del ciclo menstrual. No necesitar proteccin anticonceptiva adicional al comenzar en este momento.  Comenzar el primer domingo luego de su perodo menstrual, o el da en que adquiere el medicamento. En estos casos deber tener proteccin anticonceptiva adicional durante los primeros 7 das del ciclo. Luego de comenzar a tomar los anticonceptivos orales:   Si olvid de tomar 1 pldora, tmela tan pronto como lo recuerde. Tome la  siguiente pldora a la hora habitual.  Si olvida tomar 2  ms pldoras, utilice un mtodo anticonceptivo adicional hasta que comience su prximo perodo menstrual.  Si utiliza el envase de 28 pldoras y olvida tomar 1 de las ltimas 7 (pldoras sin hormonas), sto no tiene importancia. Simplemente deseche el resto de las pldoras que no contienen hormonas y comience un nuevo envase. No importa cuando comience a tomar los anticonceptivos, siempre empiece un nuevo envase el mismo da de la semana. Tenga un envase extra de pldoras anticonceptivas y use un mtodo anticonceptivo adicional para el caso en que se olvide de tomar algunas pldoras o pierda la caja.  INSTRUCCIONES PARA EL CUIDADO DOMICILIARIO  No fume.  Siempre use un condn para protegerse de las enfermedades de transmisin sexual. Los anticonceptivos orales no protegen contra las enfermedades de transmisin sexual.  Marque en un calendario las fechas en las que tiene sus perodos menstruales.  Lea la informacin y consejos que vienen con las pldoras. Pngase en contacto con el mdico siempre que tenga preguntas. SOLICITE ATENCIN MDICA SI:  Presenta nuseas o vmitos.  Tiene flujo o sangrado vaginal anormal.  Aparece una erupcin cutnea.  No tiene el perodo menstrual.  Pierde el cabello.  Necesita tratamiento por cambios en su estado de nimo o por depresin.  Se siente mareada al tomar la pldora.  Comienza a aparecer acn con el uso de los anticonceptivos orales.  Queda embarazada. SOLICITE ATENCIN MDICA DE INMEDIATO SI:  Siente dolor en el pecho.  Le falta el aire.  Le duele mucho la cabeza y no puede controlar el dolor.  Siente adormecimiento o tiene   dificultad para hablar.  Tiene problemas de visin.  Presenta dolor, inflamacin o hinchazn en las piernas. Document Released: 06/09/2011 Document Revised: 09/12/2011 [employer] Patient Information 2014 [employer].  

## 2013-03-28 ENCOUNTER — Other Ambulatory Visit: Payer: Self-pay | Admitting: *Deleted

## 2013-03-28 DIAGNOSIS — R7989 Other specified abnormal findings of blood chemistry: Secondary | ICD-10-CM

## 2013-03-28 DIAGNOSIS — E7800 Pure hypercholesterolemia, unspecified: Secondary | ICD-10-CM

## 2013-03-28 DIAGNOSIS — R7309 Other abnormal glucose: Secondary | ICD-10-CM

## 2013-03-28 LAB — URINALYSIS W MICROSCOPIC + REFLEX CULTURE
Casts: NONE SEEN
Glucose, UA: NEGATIVE mg/dL
Nitrite: POSITIVE — AB
Protein, ur: NEGATIVE mg/dL
pH: 6 (ref 5.0–8.0)

## 2013-03-29 LAB — URINE CULTURE

## 2013-04-01 ENCOUNTER — Other Ambulatory Visit: Payer: Self-pay

## 2013-04-01 ENCOUNTER — Other Ambulatory Visit: Payer: Self-pay | Admitting: Gynecology

## 2013-04-01 DIAGNOSIS — R7989 Other specified abnormal findings of blood chemistry: Secondary | ICD-10-CM

## 2013-04-01 DIAGNOSIS — E7800 Pure hypercholesterolemia, unspecified: Secondary | ICD-10-CM

## 2013-04-01 DIAGNOSIS — R7309 Other abnormal glucose: Secondary | ICD-10-CM

## 2013-04-01 LAB — CBC WITH DIFFERENTIAL/PLATELET
Basophils Absolute: 0 10*3/uL (ref 0.0–0.1)
Basophils Relative: 0 % (ref 0–1)
Eosinophils Absolute: 0.2 10*3/uL (ref 0.0–0.7)
Eosinophils Relative: 2 % (ref 0–5)
HCT: 37.1 % (ref 36.0–46.0)
Lymphocytes Relative: 33 % (ref 12–46)
MCH: 25.7 pg — ABNORMAL LOW (ref 26.0–34.0)
MCHC: 33.2 g/dL (ref 30.0–36.0)
MCV: 77.6 fL — ABNORMAL LOW (ref 78.0–100.0)
Monocytes Absolute: 0.5 10*3/uL (ref 0.1–1.0)
Platelets: 435 10*3/uL — ABNORMAL HIGH (ref 150–400)
RDW: 14 % (ref 11.5–15.5)
WBC: 10.1 10*3/uL (ref 4.0–10.5)

## 2013-04-01 LAB — LIPID PANEL
HDL: 53 mg/dL (ref >39)
Total CHOL/HDL Ratio: 3.6 Ratio
Triglycerides: 133 mg/dL (ref <150)

## 2013-04-01 MED ORDER — NITROFURANTOIN MONOHYD MACRO 100 MG PO CAPS: 100.0000 mg | ORAL_CAPSULE | Freq: Two times a day (BID) | ORAL | Status: DC

## 2013-04-02 ENCOUNTER — Other Ambulatory Visit: Payer: Self-pay | Admitting: Gynecology

## 2013-04-02 DIAGNOSIS — D649 Anemia, unspecified: Secondary | ICD-10-CM

## 2014-01-17 ENCOUNTER — Encounter (HOSPITAL_COMMUNITY): Payer: Self-pay | Admitting: Emergency Medicine

## 2014-01-17 ENCOUNTER — Emergency Department (HOSPITAL_COMMUNITY)
Admission: EM | Admit: 2014-01-17 | Discharge: 2014-01-18 | Disposition: A | Discharge status: Home / Self Care | Payer: No Typology Code available for payment source | Attending: Emergency Medicine | Admitting: Emergency Medicine

## 2014-01-17 DIAGNOSIS — N390 Urinary tract infection, site not specified: Secondary | ICD-10-CM | POA: Insufficient documentation

## 2014-01-17 DIAGNOSIS — Z792 Long term (current) use of antibiotics: Secondary | ICD-10-CM | POA: Insufficient documentation

## 2014-01-17 DIAGNOSIS — R1013 Epigastric pain: Secondary | ICD-10-CM | POA: Insufficient documentation

## 2014-01-17 DIAGNOSIS — Z9889 Other specified postprocedural states: Secondary | ICD-10-CM | POA: Insufficient documentation

## 2014-01-17 DIAGNOSIS — Z79899 Other long term (current) drug therapy: Secondary | ICD-10-CM | POA: Insufficient documentation

## 2014-01-17 DIAGNOSIS — R110 Nausea: Secondary | ICD-10-CM | POA: Insufficient documentation

## 2014-01-17 DIAGNOSIS — Z87442 Personal history of urinary calculi: Secondary | ICD-10-CM | POA: Insufficient documentation

## 2014-01-17 DIAGNOSIS — R1012 Left upper quadrant pain: Secondary | ICD-10-CM | POA: Insufficient documentation

## 2014-01-17 DIAGNOSIS — R1011 Right upper quadrant pain: Secondary | ICD-10-CM | POA: Insufficient documentation

## 2014-01-17 DIAGNOSIS — IMO0002 Reserved for concepts with insufficient information to code with codable children: Secondary | ICD-10-CM | POA: Insufficient documentation

## 2014-01-17 DIAGNOSIS — Z3202 Encounter for pregnancy test, result negative: Secondary | ICD-10-CM | POA: Insufficient documentation

## 2014-01-17 DIAGNOSIS — K429 Umbilical hernia without obstruction or gangrene: Secondary | ICD-10-CM | POA: Insufficient documentation

## 2014-01-17 DIAGNOSIS — Z87891 Personal history of nicotine dependence: Secondary | ICD-10-CM | POA: Insufficient documentation

## 2014-01-17 LAB — URINALYSIS, ROUTINE W REFLEX MICROSCOPIC
BILIRUBIN URINE: NEGATIVE
Glucose, UA: NEGATIVE mg/dL
Ketones, ur: NEGATIVE mg/dL
Nitrite: POSITIVE — AB
Protein, ur: NEGATIVE mg/dL
SPECIFIC GRAVITY, URINE: 1.029 (ref 1.005–1.030)
UROBILINOGEN UA: 0.2 mg/dL (ref 0.0–1.0)
pH: 5.5 (ref 5.0–8.0)

## 2014-01-17 LAB — CBC WITH DIFFERENTIAL/PLATELET
BASOS ABS: 0 10*3/uL (ref 0.0–0.1)
Basophils Relative: 0 % (ref 0–1)
Eosinophils Absolute: 0.2 10*3/uL (ref 0.0–0.7)
Eosinophils Relative: 2 % (ref 0–5)
HEMATOCRIT: 38.7 % (ref 36.0–46.0)
HEMOGLOBIN: 12.7 g/dL (ref 12.0–15.0)
LYMPHS PCT: 37 % (ref 12–46)
Lymphs Abs: 4.6 10*3/uL — ABNORMAL HIGH (ref 0.7–4.0)
MCH: 26.3 pg (ref 26.0–34.0)
MCHC: 32.8 g/dL (ref 30.0–36.0)
MCV: 80.1 fL (ref 78.0–100.0)
MONO ABS: 0.7 10*3/uL (ref 0.1–1.0)
MONOS PCT: 5 % (ref 3–12)
NEUTROS ABS: 6.9 10*3/uL (ref 1.7–7.7)
Neutrophils Relative %: 56 % (ref 43–77)
Platelets: 416 10*3/uL — ABNORMAL HIGH (ref 150–400)
RBC: 4.83 MIL/uL (ref 3.87–5.11)
RDW: 13.2 % (ref 11.5–15.5)
WBC: 12.4 10*3/uL — AB (ref 4.0–10.5)

## 2014-01-17 LAB — URINE MICROSCOPIC-ADD ON

## 2014-01-17 LAB — COMPREHENSIVE METABOLIC PANEL
ALT: 70 U/L — ABNORMAL HIGH (ref 0–35)
AST: 47 U/L — AB (ref 0–37)
Albumin: 4 g/dL (ref 3.5–5.2)
Alkaline Phosphatase: 115 U/L (ref 39–117)
Anion gap: 17 — ABNORMAL HIGH (ref 5–15)
BILIRUBIN TOTAL: 0.3 mg/dL (ref 0.3–1.2)
BUN: 16 mg/dL (ref 6–23)
CHLORIDE: 98 meq/L (ref 96–112)
CO2: 24 meq/L (ref 19–32)
CREATININE: 0.5 mg/dL (ref 0.50–1.10)
Calcium: 9.5 mg/dL (ref 8.4–10.5)
GFR calc Af Amer: >90 mL/min (ref >90)
Glucose, Bld: 90 mg/dL (ref 70–99)
Potassium: 3.8 mEq/L (ref 3.7–5.3)
Sodium: 139 mEq/L (ref 137–147)
Total Protein: 8.2 g/dL (ref 6.0–8.3)

## 2014-01-17 LAB — PREGNANCY, URINE: Preg Test, Ur: NEGATIVE

## 2014-01-17 LAB — LIPASE, BLOOD: LIPASE: 28 U/L (ref 11–59)

## 2014-01-17 MED ORDER — MORPHINE SULFATE 4 MG/ML IJ SOLN
4.0000 mg | Freq: Once | INTRAMUSCULAR | Status: AC
  Administered 2014-01-17: 4 mg via INTRAVENOUS
  Filled 2014-01-17: qty 1

## 2014-01-17 MED ORDER — ONDANSETRON HCL 4 MG/2ML IJ SOLN
4.0000 mg | Freq: Once | INTRAMUSCULAR | Status: AC
  Administered 2014-01-17: 4 mg via INTRAVENOUS
  Filled 2014-01-17: qty 2

## 2014-01-17 MED ORDER — IOHEXOL 300 MG/ML  SOLN
25.0000 mL | Freq: Once | INTRAMUSCULAR | Status: AC | PRN
  Administered 2014-01-17: 25 mL via ORAL

## 2014-01-17 NOTE — ED Notes (Signed)
The pt is c/o mid-abd pain for one week  With nausea.  lmp 2 weeks ago.

## 2014-01-17 NOTE — ED Provider Notes (Signed)
CSN: [id number]     Arrival date & time 01/17/14  2130 History   First MD Initiated Contact with Patient 01/17/14 2306     Chief Complaint  Patient presents with  . Abdominal Pain     (Consider location/radiation/quality/duration/timing/severity/associated sxs/prior Treatment) Patient is a 23 y.o. female presenting with abdominal pain.  Abdominal Pain Pain location:  Periumbilical Pain quality: sharp   Pain radiates to:  Does not radiate Pain severity:  Severe Onset quality:  Gradual Duration:  2 weeks Timing:  Constant Progression:  Worsening Chronicity:  New Context comment:  Heavy lifting at work Relieved by:  Nothing Exacerbated by: heavy lifting. Ineffective treatments:  NSAIDs Associated symptoms: nausea   Associated symptoms: no constipation, no diarrhea, no dysuria, no fever, no vaginal bleeding, no vaginal discharge and no vomiting     Past Medical History  Diagnosis Date  . Kidney stones   . Kidney stones   . Pregnancy induced hypertension   . NVD (normal vaginal delivery) 01/17/2011   Past Surgical History  Procedure Laterality Date  . Kidney stone surgery    . Cesarean section     Family History  Problem Relation Age of Onset  . Hyperlipidemia Mother   . Hyperlipidemia Father   . Hyperlipidemia Maternal Uncle   . Diabetes Maternal Uncle   . Cancer Maternal Uncle     LEUKEMIA  . Hyperlipidemia Paternal Uncle   . Diabetes Paternal Uncle   . Cancer Cousin    History  Substance Use Topics  . Smoking status: Former Smoker -- 0.10 packs/day    Quit date: 03/28/2011  . Smokeless tobacco: Not on file  . Alcohol Use: Yes     Comment: occ   OB History   Grav Para Term Preterm Abortions TAB SAB Ect Mult Living   3 2 2  1  1   2      Review of Systems  Constitutional: Negative for fever.  Gastrointestinal: Positive for nausea and abdominal pain. Negative for vomiting, diarrhea and constipation.  Genitourinary: Negative for dysuria, vaginal bleeding  and vaginal discharge.  All other systems reviewed and are negative.     Allergies  Review of patient's allergies indicates no known allergies.  Home Medications   Prior to Admission medications   Medication Sig Start Date End Date Taking? Authorizing Provider  doxycycline (VIBRAMYCIN) 100 MG capsule Take 1 capsule (100 mg total) by mouth 2 (two) times daily. 10/14/12   Angus SellerPeter S Dammen, PA-C  HYDROcodone-acetaminophen (NORCO) 5-325 MG per tablet Take 1-2 tablets by mouth every 4 (four) hours as needed for pain. 10/14/12   Phill MutterPeter S Dammen, PA-C  medroxyPROGESTERone (PROVERA) 10 MG tablet Take 1 tablet (10 mg total) by mouth daily. 03/27/13   Ok EdwardsJuan H Fernandez, MD  nitrofurantoin, macrocrystal-monohydrate, (MACROBID) 100 MG capsule Take 1 capsule (100 mg total) by mouth 2 (two) times daily. 04/01/13   Ok EdwardsJuan H Fernandez, MD  Norethindrone Acetate-Ethinyl Estrad-FE (LOMEDIA 24 FE) 1-20 MG-MCG(24) tablet Take 1 tablet by mouth daily. 03/27/13   Ok EdwardsJuan H Fernandez, MD  OVER THE COUNTER MEDICATION Take 1 tablet by mouth daily. OTC [facility] from [facility]    Historical Provider, MD   BP 114/67  Pulse 85  Temp(Src) 98.6 F (37 C) (Oral)  Resp 12  Ht 5\' 1"  (1.549 m)  Wt 214 lb (97.07 kg)  BMI 40.46 kg/m2  SpO2 98%  LMP 01/03/2014 Physical Exam  Nursing note and vitals reviewed. Constitutional: She is oriented to person, place, and  time. She appears well-developed and well-nourished. No distress.  HENT:  Head: Normocephalic and atraumatic.  Mouth/Throat: Oropharynx is clear and moist.  Eyes: Conjunctivae are normal. Pupils are equal, round, and reactive to light. No scleral icterus.  Neck: Neck supple.  Cardiovascular: Normal rate, regular rhythm, normal heart sounds and intact distal pulses.   No murmur heard. Pulmonary/Chest: Effort normal and breath sounds normal. No stridor. No respiratory distress. She has no rales.  Abdominal: Soft. Bowel sounds are normal. She exhibits no distension.  There is tenderness in the right upper quadrant, epigastric area, periumbilical area and left upper quadrant. There is no rigidity, no rebound and no guarding.  Musculoskeletal: Normal range of motion.  Neurological: She is alert and oriented to person, place, and time.  Skin: Skin is warm and dry. No rash noted.  Psychiatric: She has a normal mood and affect. Her behavior is normal.    ED Course  Procedures (including critical care time) Labs Review Labs Reviewed  CBC WITH DIFFERENTIAL - Abnormal; Notable for the following:    WBC 12.4 (*)    Platelets 416 (*)    Lymphs Abs 4.6 (*)    All other components within normal limits  COMPREHENSIVE METABOLIC PANEL - Abnormal; Notable for the following:    AST 47 (*)    ALT 70 (*)    Anion gap 17 (*)    All other components within normal limits  URINALYSIS, ROUTINE W REFLEX MICROSCOPIC - Abnormal; Notable for the following:    APPearance CLOUDY (*)    Hgb urine dipstick TRACE (*)    Nitrite POSITIVE (*)    Leukocytes, UA SMALL (*)    All other components within normal limits  URINE MICROSCOPIC-ADD ON - Abnormal; Notable for the following:    Squamous Epithelial / LPF FEW (*)    Bacteria, UA MANY (*)    All other components within normal limits  LIPASE, BLOOD  PREGNANCY, URINE    Imaging Review Ct Abdomen Pelvis W Contrast  01/18/2014   CLINICAL DATA:  Periumbilical abdominal pain for 2 weeks. Nausea and vomiting.  EXAM: CT ABDOMEN AND PELVIS WITH CONTRAST  TECHNIQUE: Multidetector CT imaging of the abdomen and pelvis was performed using the standard protocol following bolus administration of intravenous contrast.  CONTRAST:  OMNIPAQUE IOHEXOL 300 MG/ML  SOLN  COMPARISON:  Abdominal ultrasound performed 09/14/2005  FINDINGS: The visualized lung bases are clear.  The liver and spleen are unremarkable in appearance. The patient is status post cholecystectomy, with clips noted along the gallbladder fossa. The pancreas and adrenal  glands are unremarkable.  The kidneys are unremarkable in appearance. There is no evidence of hydronephrosis. No renal or ureteral stones are seen. No perinephric stranding is appreciated.  No free fluid is identified. The small bowel is unremarkable in appearance. The stomach is within normal limits. No acute vascular abnormalities are seen.  The appendix is normal in caliber and contains air, without evidence for appendicitis. The colon is decompressed and unremarkable in appearance.  A small umbilical hernia is noted, containing only fat, with mild associated soft tissue inflammation.  The bladder is largely decompressed and grossly unremarkable. The uterus is within normal limits. The ovaries are relatively symmetric. No suspicious adnexal masses are seen. No inguinal lymphadenopathy is seen.  No acute osseous abnormalities are identified.  IMPRESSION: 1. Small umbilical hernia noted, containing only fat, with mild associated soft tissue inflammation. This may correspond to the patient's symptoms. 2. Otherwise unremarkable CT of the  abdomen and pelvis.   Electronically Signed   By: Roanna Raider M.D.   On: 01/18/2014 00:54  All radiology studies independently viewed by me.      EKG Interpretation None      MDM   Final diagnoses:  Umbilical hernia, recurrence not specified  Urinary tract infection without hematuria, site unspecified    23 yo female with periumbilical abdominal pain for two weeks.  Nontoxic, well appearing.  Abdomen soft, TTP in upper abdomen and periumbilical region.  She thinks there is a lump near her umbilicus, which I don't appreciate on exam (limited by obesity).  Plan labs, CT.  CT shows small fat containing umbilical hernia.  Will refer to gen surgery.  Labwork also shows nitrite positive UTI.  Will treat with keflex.   Candyce Churn III, MD 01/18/14 [phone]

## 2014-01-18 ENCOUNTER — Encounter (HOSPITAL_COMMUNITY): Payer: Self-pay

## 2014-01-18 ENCOUNTER — Emergency Department (HOSPITAL_COMMUNITY): Payer: No Typology Code available for payment source

## 2014-01-18 MED ORDER — IOHEXOL 300 MG/ML  SOLN
100.0000 mL | Freq: Once | INTRAMUSCULAR | Status: AC | PRN
  Administered 2014-01-18: 100 mL via INTRAVENOUS

## 2014-01-18 MED ORDER — CEPHALEXIN 500 MG PO CAPS: 500.0000 mg | ORAL_CAPSULE | Freq: Two times a day (BID) | ORAL | Status: DC

## 2014-01-18 NOTE — ED Notes (Signed)
CT notified, pt finished with contrast.  

## 2014-05-05 ENCOUNTER — Encounter (HOSPITAL_COMMUNITY): Payer: Self-pay

## 2015-12-30 ENCOUNTER — Encounter (HOSPITAL_COMMUNITY): Payer: Self-pay | Admitting: *Deleted

## 2015-12-30 ENCOUNTER — Inpatient Hospital Stay (HOSPITAL_COMMUNITY)
Admission: AD | Admit: 2015-12-30 | Discharge: 2015-12-30 | Disposition: A | Discharge status: Home / Self Care | Payer: Medicaid Other | Source: Ambulatory Visit | Attending: Obstetrics and Gynecology | Admitting: Obstetrics and Gynecology

## 2015-12-30 ENCOUNTER — Inpatient Hospital Stay (HOSPITAL_COMMUNITY): Payer: Medicaid Other

## 2015-12-30 DIAGNOSIS — O99331 Smoking (tobacco) complicating pregnancy, first trimester: Secondary | ICD-10-CM | POA: Diagnosis not present

## 2015-12-30 DIAGNOSIS — F17210 Nicotine dependence, cigarettes, uncomplicated: Secondary | ICD-10-CM | POA: Insufficient documentation

## 2015-12-30 DIAGNOSIS — O2341 Unspecified infection of urinary tract in pregnancy, first trimester: Secondary | ICD-10-CM | POA: Diagnosis not present

## 2015-12-30 DIAGNOSIS — Z3A12 12 weeks gestation of pregnancy: Secondary | ICD-10-CM | POA: Insufficient documentation

## 2015-12-30 DIAGNOSIS — R1030 Lower abdominal pain, unspecified: Secondary | ICD-10-CM | POA: Diagnosis present

## 2015-12-30 HISTORY — DX: Anemia, unspecified: D64.9

## 2015-12-30 LAB — CBC WITH DIFFERENTIAL/PLATELET
BASOS PCT: 0 %
Basophils Absolute: 0 10*3/uL (ref 0.0–0.1)
Eosinophils Absolute: 0.1 10*3/uL (ref 0.0–0.7)
Eosinophils Relative: 2 %
HEMATOCRIT: 34.5 % — AB (ref 36.0–46.0)
HEMOGLOBIN: 11.6 g/dL — AB (ref 12.0–15.0)
LYMPHS ABS: 2.3 10*3/uL (ref 0.7–4.0)
Lymphocytes Relative: 25 %
MCH: 26.3 pg (ref 26.0–34.0)
MCHC: 33.6 g/dL (ref 30.0–36.0)
MCV: 78.2 fL (ref 78.0–100.0)
MONOS PCT: 4 %
Monocytes Absolute: 0.4 10*3/uL (ref 0.1–1.0)
NEUTROS ABS: 6.7 10*3/uL (ref 1.7–7.7)
NEUTROS PCT: 69 %
Platelets: 351 10*3/uL (ref 150–400)
RBC: 4.41 MIL/uL (ref 3.87–5.11)
RDW: 14.1 % (ref 11.5–15.5)
WBC: 9.5 10*3/uL (ref 4.0–10.5)

## 2015-12-30 LAB — URINALYSIS, ROUTINE W REFLEX MICROSCOPIC
BILIRUBIN URINE: NEGATIVE
GLUCOSE, UA: NEGATIVE mg/dL
Ketones, ur: NEGATIVE mg/dL
Nitrite: POSITIVE — AB
PROTEIN: NEGATIVE mg/dL
Specific Gravity, Urine: 1.025 (ref 1.005–1.030)
pH: 6 (ref 5.0–8.0)

## 2015-12-30 LAB — COMPREHENSIVE METABOLIC PANEL
ALBUMIN: 3.8 g/dL (ref 3.5–5.0)
ALK PHOS: 59 U/L (ref 38–126)
ALT: 15 U/L (ref 14–54)
ANION GAP: 9 (ref 5–15)
AST: 18 U/L (ref 15–41)
BILIRUBIN TOTAL: 0.3 mg/dL (ref 0.3–1.2)
BUN: 10 mg/dL (ref 6–20)
CALCIUM: 9.1 mg/dL (ref 8.9–10.3)
CO2: 23 mmol/L (ref 22–32)
CREATININE: 0.49 mg/dL (ref 0.44–1.00)
Chloride: 101 mmol/L (ref 101–111)
GFR calc Af Amer: >60 mL/min (ref >60)
GFR calc non Af Amer: >60 mL/min (ref >60)
GLUCOSE: 109 mg/dL — AB (ref 65–99)
Potassium: 3.7 mmol/L (ref 3.5–5.1)
SODIUM: 133 mmol/L — AB (ref 135–145)
TOTAL PROTEIN: 8 g/dL (ref 6.5–8.1)

## 2015-12-30 LAB — URINE MICROSCOPIC-ADD ON

## 2015-12-30 LAB — WET PREP, GENITAL
CLUE CELLS WET PREP: NONE SEEN
Sperm: NONE SEEN
Trich, Wet Prep: NONE SEEN
Yeast Wet Prep HPF POC: NONE SEEN

## 2015-12-30 LAB — POCT PREGNANCY, URINE: PREG TEST UR: POSITIVE — AB

## 2015-12-30 MED ORDER — CEPHALEXIN 500 MG PO CAPS: 500.0000 mg | ORAL_CAPSULE | Freq: Four times a day (QID) | ORAL | Status: DC

## 2015-12-30 NOTE — MAU Note (Signed)
Pt received to MAU rm 4 c/o lower abdominal pain whihch the patient describes as cramping with occasional stabbing like pain. Pt states this pain started over 2 weeks ago. Pt states that she has taken ibuprofen without relief on several occasions. Pt states that she had her last period during the first week in April with 1 positive pregnancy test with 2 concurrent negative pregnancy test. Patient denies any vaginal bleeding.VSS stable. Will contact provider. Carmelina DaneERRI L Jaydyn Menon, RN

## 2015-12-30 NOTE — MAU Note (Signed)
Pt d/c'd home with written instructions. Pt verbalizes and understanding. No concerns noted at this time. Kara Booker L Kara Booker

## 2015-12-30 NOTE — MAU Provider Note (Signed)
CSN: [id number]     Arrival date & time 12/30/15  1108 History   None    Chief Complaint  Patient presents with  . Abdominal Pain     Abdominal Pain This is a new problem. Associated symptoms include nausea. Pertinent negatives include no constipation, diarrhea, dysuria, fever, frequency, headaches, myalgias or vomiting.   Kara Booker is a 25 y.o. [id number] @ 3831w2d gestation who presents to MAU with lower abdominal pain x 2 weeks. Patient describes the pain as cramping "constantly" and occasional stabbing pains in the right adnexa. Pain has gotten worse over the past two weeks. The pain does not radiate. She took 600mg  ibuprofen x 1 with no relief. She endorses some nausea but denies vomiting, diarrhea, constipation and headache. The patient has not had a fever over the past two weeks. Patient denies dysuria and vaginal bleeding or discharge. She states that she is currently in a monogamous relationship but would like to be checked for STIs.   Past Medical History  Diagnosis Date  . Kidney stones   . Kidney stones   . Pregnancy induced hypertension   . NVD (normal vaginal delivery) 01/17/2011  . Anemia    Past Surgical History  Procedure Laterality Date  . Kidney stone surgery    . Cesarean section     Family History  Problem Relation Age of Onset  . Hyperlipidemia Mother   . Hyperlipidemia Father   . Hyperlipidemia Maternal Uncle   . Diabetes Maternal Uncle   . Cancer Maternal Uncle     LEUKEMIA  . Hyperlipidemia Paternal Uncle   . Diabetes Paternal Uncle   . Cancer Cousin    Social History  Substance Use Topics  . Smoking status: Current Some Day Smoker -- 0.10 packs/day    Types: Cigarettes    Last Attempt to Quit: 03/28/2011  . Smokeless tobacco: None  . Alcohol Use: No     Comment: occ   OB History    Gravida Para Term Preterm AB TAB SAB Ectopic Multiple Living   4 2 2  1  1   2      Review of Systems  Constitutional: Negative for fever and chills.  Eyes:  Negative for visual disturbance.  Respiratory: Negative for cough, chest tightness and shortness of breath.   Cardiovascular: Negative for chest pain and leg swelling.  Gastrointestinal: Positive for nausea and abdominal pain (lower right). Negative for vomiting, diarrhea and constipation.  Endocrine: Negative for polydipsia and polyuria.  Genitourinary: Positive for pelvic pain. Negative for dysuria, frequency, vaginal bleeding, vaginal discharge, difficulty urinating and menstrual problem.  Musculoskeletal: Negative for myalgias and back pain.  Neurological: Negative for dizziness, syncope, light-headedness and headaches.      Allergies  Review of patient's allergies indicates no known allergies.  Home Medications   Prior to Admission medications   Medication Sig Start Date End Date Taking? Authorizing Provider  cephALEXin (KEFLEX) 500 MG capsule Take 1 capsule (500 mg total) by mouth 2 (two) times daily. 01/18/14   Blake DivineJohn Wofford, MD  ibuprofen (ADVIL,MOTRIN) 200 MG tablet Take 200 mg by mouth every 6 (six) hours as needed for mild pain.    Historical Provider, MD   BP 111/62 mmHg  Pulse 91  Temp(Src) 98.5 F (36.9 C) (Oral)  Resp 16  Ht 5\' 1"  (1.549 m)  Wt 219 lb (99.338 kg)  BMI 41.40 kg/m2  SpO2 100%  LMP 10/05/2015 (Approximate) Physical Exam  Constitutional: She is oriented to person, place, and  time. No distress.  Eyes: Conjunctivae and EOM are normal. Pupils are equal, round, and reactive to light.  Cardiovascular: Normal rate, regular rhythm and normal heart sounds.   Pulmonary/Chest: Effort normal and breath sounds normal. No respiratory distress.  Abdominal: Soft. She exhibits no mass. There is tenderness (RLQ and center, no pain on left). There is no guarding.  Genitourinary: Pelvic exam was performed with patient supine. There is no rash, tenderness, lesion or injury on the right labia. There is no rash, tenderness, lesion or injury on the left labia. Cervix exhibits  discharge (green-yellow mucoid discharge). Cervix exhibits no friability. Right adnexum displays tenderness. Left adnexum displays no tenderness. No erythema, tenderness or bleeding in the vagina. Vaginal discharge found.  Musculoskeletal: Normal range of motion. She exhibits no tenderness (no CVA tenderness).  Neurological: She is alert and oriented to person, place, and time. She has normal strength. Coordination normal.  Skin: Skin is warm and dry.  Psychiatric: She has a normal mood and affect. Her behavior is normal. Thought content normal.    ED Course  Procedures (including critical care time) Labs Review Results for orders placed or performed during the hospital encounter of 12/30/15 (from the past 24 hour(s))  Urinalysis, Routine w reflex microscopic ([facility])     Status: Abnormal   Collection Time: 12/30/15 11:31 AM  Result Value Ref Range   Color, Urine YELLOW YELLOW   APPearance CLEAR CLEAR   Specific Gravity, Urine 1.025 1.005 - 1.030   pH 6.0 5.0 - 8.0   Glucose, UA NEGATIVE NEGATIVE mg/dL   Hgb urine dipstick TRACE (A) NEGATIVE   Bilirubin Urine NEGATIVE NEGATIVE   Ketones, ur NEGATIVE NEGATIVE mg/dL   Protein, ur NEGATIVE NEGATIVE mg/dL   Nitrite POSITIVE (A) NEGATIVE   Leukocytes, UA SMALL (A) NEGATIVE  Urine microscopic-add on     Status: Abnormal   Collection Time: 12/30/15 11:31 AM  Result Value Ref Range   Squamous Epithelial / LPF 6-30 (A) NONE SEEN   WBC, UA 0-5 0 - 5 WBC/hpf   RBC / HPF 0-5 0 - 5 RBC/hpf   Bacteria, UA MANY (A) NONE SEEN   Urine-Other MUCOUS PRESENT   Pregnancy, urine POC     Status: Abnormal   Collection Time: 12/30/15 11:39 AM  Result Value Ref Range   Preg Test, Ur POSITIVE (A) NEGATIVE    Imaging Review [place] Ob Comp Less 14 Wks  12/30/2015  CLINICAL DATA:  First trimester pregnancy with pelvic pain. Positive urine pregnancy test. LMP 10/05/2015. EXAM: OBSTETRIC <14 WK ULTRASOUND TECHNIQUE: Transabdominal ultrasound was performed  for evaluation of the gestation as well as the maternal uterus and adnexal regions. COMPARISON:  None. FINDINGS: Intrauterine gestational sac: Visualized/normal in shape. Yolk sac:  Visualized. Embryo:  Visualized. Cardiac Activity: Visualized. Heart Rate: 167  bpm CRL:  36.7  mm; 10 w 4 d;                  US EDC: 07/23/2016 Subchorionic hemorrhage: None. Maternal uterus/adnexae: Both maternal ovaries are visualized. No adnexal mass or significant free pelvic fluid. IMPRESSION: Single live intrauterine pregnancy with best estimated gestational age of [redacted] weeks 4 days. No fetal, placental or adnexal abnormalities seen. Electronically Signed   By: Carey BullocksWilliam  Veazey M.D.   On: 12/30/2015 14:11    MDM  Care turned over to Judeth HornErin Lalisa Kiehn, NP @ 1350 patient awaiting ultrasound.   Vaginal ultrasound CBC GC/Chlamydia Wet prep RPR/HIV  Ultrasound shows SIUP with cardiac  activity, no adnexal masses Will tx for UTI & send Urine for culture     A: 1. UTI in pregnancy, antepartum, first trimester     P: Discharge home Rx keflex Urine culture pending GC/CT pending Start prenatal care Discussed reasons to return to MAU  Judeth Horn, NP

## 2015-12-30 NOTE — Discharge Instructions (Signed)
Pregnancy and Urinary Tract Infection °A urinary tract infection (UTI) is a bacterial infection of the urinary tract. Infection of the urinary tract can include the ureters, kidneys (pyelonephritis), bladder (cystitis), and urethra (urethritis). All pregnant women should be screened for bacteria in the urinary tract. Identifying and treating a UTI will decrease the risk of preterm labor and developing more serious infections in both the mother and baby. °CAUSES °Bacteria germs cause almost all UTIs.  °RISK FACTORS °Many factors can increase your chances of getting a UTI during pregnancy. These include: °· Having a short urethra. °· Poor toilet and hygiene habits. °· Sexual intercourse. °· Blockage of urine along the urinary tract. °· Problems with the pelvic muscles or nerves. °· Diabetes. °· Obesity. °· Bladder problems after having several children. °· Previous history of UTI. °SIGNS AND SYMPTOMS  °· Pain, burning, or a stinging feeling when urinating. °· Suddenly feeling the need to urinate right away (urgency). °· Loss of bladder control (urinary incontinence). °· Frequent urination, more than is common with pregnancy. °· Lower abdominal or back discomfort. °· Cloudy urine. °· Blood in the urine (hematuria). °· Fever.  °When the kidneys are infected, the symptoms may be: °· Back pain. °· Flank pain on the right side more so than the left. °· Fever. °· Chills. °· Nausea. °· Vomiting. °DIAGNOSIS  °A urinary tract infection is usually diagnosed through urine tests. Additional tests and procedures are sometimes done. These may include: °· Ultrasound exam of the kidneys, ureters, bladder, and urethra. °· Looking in the bladder with a lighted tube (cystoscopy). °TREATMENT °Typically, UTIs can be treated with antibiotic medicines.  °HOME CARE INSTRUCTIONS  °· Only take over-the-counter or prescription medicines as directed by your health care provider. If you were prescribed antibiotics, take them as directed. Finish  them even if you start to feel better. °· Drink enough fluids to keep your urine clear or pale yellow. °· Do not have sexual intercourse until the infection is gone and your health care provider says it is okay. °· Make sure you are tested for UTIs throughout your pregnancy. These infections often come back.  °Preventing a UTI in the Future °· Practice good toilet habits. Always wipe from front to back. Use the tissue only once. °· Do not hold your urine. Empty your bladder as soon as possible when the urge comes. °· Do not douche or use deodorant sprays. °· Wash with soap and warm water around the genital area and the anus. °· Empty your bladder before and after sexual intercourse. °· Wear underwear with a cotton crotch. °· Avoid caffeine and carbonated drinks. They can irritate the bladder. °· Drink cranberry juice or take cranberry pills. This may decrease the risk of getting a UTI. °· Do not drink alcohol. °· Keep all your appointments and tests as scheduled.  °SEEK MEDICAL CARE IF:  °· Your symptoms get worse. °· You are still having fevers 2 or more days after treatment begins. °· You have a rash. °· You feel that you are having problems with medicines prescribed. °· You have abnormal vaginal discharge. °SEEK IMMEDIATE MEDICAL CARE IF:  °· You have back or flank pain. °· You have chills. °· You have blood in your urine. °· You have nausea and vomiting. °· You have contractions of your uterus. °· You have a gush of fluid from the vagina. °MAKE SURE YOU: °· Understand these instructions.   °· Will watch your condition.   °· Will get help right away if you are not doing   well or get worse.    This information is not intended to replace advice given to you by your health care provider. Make sure you discuss any questions you have with your health care provider.   Document Released: 10/15/2010 Document Revised: 04/10/2013 Document Reviewed: 01/17/2013 Elsevier Interactive Patient Education 2016 Elsevier  Inc[employer]     [facility]   Francoise CeoBernard Marshall      Phone: [phone]  [facility]     Phone: [phone]  [facility]  Phone: [phone]  [facility]  Phone: [phone]  [facility]    Phone: [phone]   [facility]    Phone: [phone]  [facility]    Phone: [phone]  [facility]    Phone: [phone]  [facility]    Phone: [phone]  [facility] Phone: [phone]   [facility]  Phone: [phone]  [facility]    Phone: [phone]  [facility]   Phone: [phone]  [facility]      Phone: [phone]  [facility]    Phone: [phone]  [facility]     Phone: [phone]

## 2015-12-31 LAB — CULTURE, OB URINE

## 2015-12-31 LAB — RPR: RPR Ser Ql: NONREACTIVE

## 2015-12-31 LAB — HIV ANTIBODY (ROUTINE TESTING W REFLEX): HIV SCREEN 4TH GENERATION: NONREACTIVE

## 2015-12-31 LAB — GC/CHLAMYDIA PROBE AMP (~~LOC~~) NOT AT ARMC
Chlamydia: NEGATIVE
NEISSERIA GONORRHEA: NEGATIVE

## 2016-02-02 ENCOUNTER — Ambulatory Visit (INDEPENDENT_AMBULATORY_CARE_PROVIDER_SITE_OTHER): Payer: Medicaid Other | Admitting: Family Medicine

## 2016-02-02 ENCOUNTER — Encounter: Payer: Self-pay | Admitting: Family Medicine

## 2016-02-02 VITALS — Wt 216.0 lb

## 2016-02-02 DIAGNOSIS — O09292 Supervision of pregnancy with other poor reproductive or obstetric history, second trimester: Secondary | ICD-10-CM | POA: Diagnosis not present

## 2016-02-02 DIAGNOSIS — Z3482 Encounter for supervision of other normal pregnancy, second trimester: Secondary | ICD-10-CM | POA: Diagnosis not present

## 2016-02-02 DIAGNOSIS — Z8632 Personal history of gestational diabetes: Secondary | ICD-10-CM

## 2016-02-02 DIAGNOSIS — Z98891 History of uterine scar from previous surgery: Secondary | ICD-10-CM

## 2016-02-02 DIAGNOSIS — O09299 Supervision of pregnancy with other poor reproductive or obstetric history, unspecified trimester: Secondary | ICD-10-CM | POA: Insufficient documentation

## 2016-02-02 DIAGNOSIS — Z3480 Encounter for supervision of other normal pregnancy, unspecified trimester: Secondary | ICD-10-CM | POA: Insufficient documentation

## 2016-02-02 MED ORDER — ASPIRIN EC 81 MG PO TBEC: 81.0000 mg | DELAYED_RELEASE_TABLET | Freq: Every day | ORAL | 2 refills | Status: DC

## 2016-02-02 NOTE — Progress Notes (Signed)
  Subjective:    Kara Booker is being seen today for her first obstetrical visit.  This is not a planned pregnancy. She is at [redacted]w[redacted]d gestation. Her obstetrical history is significant for obesity, pre-eclampsia and previous cesarean section with successful VBAC afterwards. Relationship with FOB: significant other, living together. Patient does intend to breast feed. Pregnancy history fully reviewed.  Patient reports no complaints.  Review of Systems:   Review of Systems  Objective:     Wt 216 lb (98 kg)   LMP 10/05/2015 (Approximate)   BMI 40.81 kg/m  Physical Exam  Exam    Assessment:    Pregnancy: [id number] Patient Active Problem List   Diagnosis Date Noted  . Supervision of other normal pregnancy, antepartum 02/02/2016  . History of cesarean section 02/02/2016  . Hx of preeclampsia, prior pregnancy, currently pregnant 02/02/2016  . History of gestational diabetes mellitus, not currently pregnant 03/27/2013  . Oligomenorrhea 03/27/2013  . Overweight 03/27/2013  . Acne 03/27/2013       Plan:     1. Supervision of other normal pregnancy, antepartum, second trimester FHT normal.  [place] in 4 weeks.  PNV.  Counseling given.  Reviewed previous deliveries. - AFP, Quad Screen - Glucose Tolerance, 2 Hours w/1 Hour - HIV antibody - Pap IG w/ reflex to HPV when ASC-U - ToxASSURE Select 13 (MW), Urine - Culture, OB Urine - US OB Comp + 14 Wk; Future - Prenatal Profile I  2. History of cesarean section Desires VBAC.  Discussed risks.  Will need to sign consent  3. History of gestational diabetes mellitus, not currently pregnant - Glucose Tolerance, 2 Hours w/1 Hour  4. Hx of preeclampsia, prior pregnancy, currently pregnant, second trimester Start ASA 81mg .  BP currently controlled.  Levie Heritage, DO 02/02/2016

## 2016-02-03 LAB — PAP IG W/ RFLX HPV ASCU: PAP SMEAR COMMENT: 0

## 2016-02-05 ENCOUNTER — Other Ambulatory Visit: Payer: Medicaid Other

## 2016-02-05 LAB — URINE CULTURE, OB REFLEX

## 2016-02-05 LAB — CULTURE, OB URINE

## 2016-02-11 LAB — TOXASSURE SELECT 13 (MW), URINE: PDF: 0

## 2016-02-17 ENCOUNTER — Encounter: Payer: Self-pay | Admitting: *Deleted

## 2016-02-18 ENCOUNTER — Telehealth: Payer: Self-pay | Admitting: *Deleted

## 2016-02-18 ENCOUNTER — Other Ambulatory Visit: Payer: Self-pay | Admitting: *Deleted

## 2016-02-18 DIAGNOSIS — O24419 Gestational diabetes mellitus in pregnancy, unspecified control: Secondary | ICD-10-CM

## 2016-02-18 LAB — AFP, QUAD SCREEN
DIA MOM VALUE: 1.05
DIA Value (EIA): 150.18 pg/mL
DSR (BY AGE) 1 IN: 898
DSR (SECOND TRIMESTER) 1 IN: 454
Gestational Age: 16.4 WEEKS
MATERNAL AGE AT EDD: 27.4 a
MSAFP MOM: 0.43
MSAFP: 11.8 ng/mL
MSHCG Mom: 1.03
MSHCG: 30750 m[IU]/mL
OSB RISK: 10000
PDF: 0
T18 (By Age): 1:3500 {titer}
TEST RESULTS AFP: NEGATIVE
WEIGHT: 216 [lb_av]
uE3 Mom: 0.54
uE3 Value: 0.45 ng/mL

## 2016-02-18 LAB — PRENATAL PROFILE I(LABCORP)
Antibody Screen: NEGATIVE
BASOS ABS: 0 10*3/uL (ref 0.0–0.2)
BASOS: 0 %
EOS (ABSOLUTE): 0.2 10*3/uL (ref 0.0–0.4)
Eos: 2 %
HEMATOCRIT: 35.7 % (ref 34.0–46.6)
HEMOGLOBIN: 11.2 g/dL (ref 11.1–15.9)
Hepatitis B Surface Ag: NEGATIVE
IMMATURE GRANS (ABS): 0.1 10*3/uL (ref 0.0–0.1)
Immature Granulocytes: 1 %
LYMPHS: 22 %
Lymphocytes Absolute: 2 10*3/uL (ref 0.7–3.1)
MCH: 26.4 pg — ABNORMAL LOW (ref 26.6–33.0)
MCHC: 31.4 g/dL — AB (ref 31.5–35.7)
MCV: 84 fL (ref 79–97)
MONOCYTES: 5 %
MONOS ABS: 0.5 10*3/uL (ref 0.1–0.9)
NEUTROS PCT: 70 %
Neutrophils Absolute: 6.4 10*3/uL (ref 1.4–7.0)
PLATELETS: 314 10*3/uL (ref 150–379)
RBC: 4.25 x10E6/uL (ref 3.77–5.28)
RDW: 14.4 % (ref 12.3–15.4)
RPR: NONREACTIVE
Rh Factor: POSITIVE
Rubella Antibodies, IGG: 1.3 index (ref >0.99)
WBC: 9.2 10*3/uL (ref 3.4–10.8)

## 2016-02-18 LAB — HIV ANTIBODY (ROUTINE TESTING W REFLEX): HIV Screen 4th Generation wRfx: NONREACTIVE

## 2016-02-18 LAB — GLUCOSE TOLERANCE, 2 HOURS W/ 1HR
Glucose, 1 hour: 178 mg/dL (ref 65–179)
Glucose, 2 hour: 166 mg/dL — ABNORMAL HIGH (ref 65–152)
Glucose, Fasting: 89 mg/dL (ref 65–91)

## 2016-02-18 NOTE — Telephone Encounter (Signed)
Message left for patient about her lab results.

## 2016-02-23 ENCOUNTER — Encounter (HOSPITAL_COMMUNITY): Payer: Self-pay | Admitting: Family Medicine

## 2016-03-03 ENCOUNTER — Ambulatory Visit (INDEPENDENT_AMBULATORY_CARE_PROVIDER_SITE_OTHER): Payer: Medicaid Other

## 2016-03-03 ENCOUNTER — Ambulatory Visit (INDEPENDENT_AMBULATORY_CARE_PROVIDER_SITE_OTHER): Payer: Medicaid Other | Admitting: Obstetrics

## 2016-03-03 ENCOUNTER — Ambulatory Visit (HOSPITAL_COMMUNITY): Payer: No Typology Code available for payment source | Attending: Family Medicine

## 2016-03-03 ENCOUNTER — Encounter: Payer: Self-pay | Admitting: Obstetrics

## 2016-03-03 VITALS — BP 101/71 | HR 89 | Temp 98.4°F | Wt 218.9 lb

## 2016-03-03 DIAGNOSIS — Z3482 Encounter for supervision of other normal pregnancy, second trimester: Secondary | ICD-10-CM

## 2016-03-03 DIAGNOSIS — O09292 Supervision of pregnancy with other poor reproductive or obstetric history, second trimester: Secondary | ICD-10-CM | POA: Diagnosis not present

## 2016-03-03 DIAGNOSIS — Z36 Encounter for antenatal screening of mother: Secondary | ICD-10-CM

## 2016-03-03 DIAGNOSIS — Z1389 Encounter for screening for other disorder: Secondary | ICD-10-CM

## 2016-03-03 DIAGNOSIS — Z331 Pregnant state, incidental: Secondary | ICD-10-CM

## 2016-03-03 LAB — POCT URINALYSIS DIPSTICK
Bilirubin, UA: NEGATIVE
GLUCOSE UA: NEGATIVE
KETONES UA: NEGATIVE
Nitrite, UA: NEGATIVE
RBC UA: NEGATIVE
UROBILINOGEN UA: 0.2
pH, UA: 8

## 2016-03-03 NOTE — Addendum Note (Signed)
Addended by: Lear NgMARTIN, MISTY L on: 03/03/2016 04:10 PM   Modules accepted: Orders

## 2016-03-03 NOTE — Progress Notes (Signed)
Pt. Denies any questions or concerns at this time. Subjective:    Kara Booker is a 25 y.o. female being seen today for her obstetrical visit. She is at 4867w5d gestation. Patient reports: no complaints . Fetal movement: normal.  Problem List Items Addressed This Visit    None    Visit Diagnoses   None.    Patient Active Problem List   Diagnosis Date Noted  . Supervision of other normal pregnancy, antepartum 02/02/2016  . History of cesarean section 02/02/2016  . Hx of preeclampsia, prior pregnancy, currently pregnant 02/02/2016  . History of gestational diabetes mellitus, not currently pregnant 03/27/2013  . Oligomenorrhea 03/27/2013  . Overweight 03/27/2013  . Acne 03/27/2013   Objective:    BP 101/71   Pulse 89   Temp 98.4 F (36.9 C)   Wt 218 lb 14.4 oz (99.3 kg)   LMP 10/05/2015 (Approximate)   BMI 41.36 kg/m  FHT: 140 BPM  Uterine Size: size equals dates     Assessment:    Pregnancy @ 8267w5d    Plan:    Signs and symptoms of preterm labor: discussed.  Labs, problem list reviewed and updated 2 hr GTT planned Follow up in 4 weeks.

## 2016-03-31 ENCOUNTER — Ambulatory Visit (INDEPENDENT_AMBULATORY_CARE_PROVIDER_SITE_OTHER): Payer: Medicaid Other | Admitting: Obstetrics

## 2016-03-31 VITALS — BP 108/70 | HR 77 | Temp 99.4°F | Wt 219.9 lb

## 2016-03-31 DIAGNOSIS — Z3482 Encounter for supervision of other normal pregnancy, second trimester: Secondary | ICD-10-CM

## 2016-03-31 DIAGNOSIS — Z3480 Encounter for supervision of other normal pregnancy, unspecified trimester: Secondary | ICD-10-CM

## 2016-03-31 NOTE — Progress Notes (Signed)
Patient is complaining about bad headaches- not using anything at this time.

## 2016-04-07 ENCOUNTER — Encounter: Payer: Self-pay | Admitting: Obstetrics

## 2016-04-07 NOTE — Progress Notes (Signed)
Subjective:    Dewanda A Sondra ComeCruz is a 25 y.o. female being seen today for her obstetrical visit. She is at 5449w5d gestation. Patient reports: no complaints . Fetal movement: normal.  Problem List Items Addressed This Visit    None    Visit Diagnoses   None.    Patient Active Problem List   Diagnosis Date Noted  . Supervision of other normal pregnancy, antepartum 02/02/2016  . History of cesarean section 02/02/2016  . Hx of preeclampsia, prior pregnancy, currently pregnant 02/02/2016  . History of gestational diabetes mellitus, not currently pregnant 03/27/2013  . Oligomenorrhea 03/27/2013  . Overweight 03/27/2013  . Acne 03/27/2013   Objective:    BP 108/70   Pulse 77   Temp 99.4 F (37.4 C)   Wt 219 lb 14.4 oz (99.7 kg)   LMP 10/05/2015 (Approximate)   BMI 41.55 kg/m  FHT: 150 BPM  Uterine Size: size equals dates     Assessment:    Pregnancy @ 349w5d    Plan:    OBGCT: ordered for next visit.  Labs, problem list reviewed and updated 2 hr GTT planned Follow up in 4 weeks.  Patient ID: Hulan FrayLlasselen A Natividad, female   DOB: 1991-05-07, 25 y.o.   MRN: [id number]

## 2016-04-28 ENCOUNTER — Ambulatory Visit (INDEPENDENT_AMBULATORY_CARE_PROVIDER_SITE_OTHER): Payer: Medicaid Other | Admitting: Obstetrics

## 2016-04-28 ENCOUNTER — Other Ambulatory Visit: Payer: Medicaid Other

## 2016-04-28 ENCOUNTER — Encounter: Payer: Self-pay | Admitting: Obstetrics

## 2016-04-28 VITALS — BP 101/71 | HR 88 | Wt 221.0 lb

## 2016-04-28 DIAGNOSIS — O0993 Supervision of high risk pregnancy, unspecified, third trimester: Secondary | ICD-10-CM

## 2016-04-28 DIAGNOSIS — O99810 Abnormal glucose complicating pregnancy: Secondary | ICD-10-CM

## 2016-04-28 DIAGNOSIS — Z23 Encounter for immunization: Secondary | ICD-10-CM | POA: Diagnosis not present

## 2016-04-28 DIAGNOSIS — O24419 Gestational diabetes mellitus in pregnancy, unspecified control: Secondary | ICD-10-CM | POA: Insufficient documentation

## 2016-04-28 HISTORY — DX: Gestational diabetes mellitus in pregnancy, unspecified control: O24.419

## 2016-04-28 NOTE — Addendum Note (Signed)
Addended by: Arne ClevelandHUTCHINSON, MANDY J on: 04/28/2016 10:25 AM   Modules accepted: Orders

## 2016-04-28 NOTE — Progress Notes (Signed)
Subjective:    Kara Booker is a 25 y.o. female being seen today for her obstetrical visit. She is at 937w5d gestation. Patient reports: Abdominal pressure in area of ventral hernia . Fetal movement: normal.  Problem List Items Addressed This Visit    Supervision of other normal pregnancy, antepartum    Other Visit Diagnoses   None.    Patient Active Problem List   Diagnosis Date Noted  . Gestational diabetes mellitus 04/28/2016  . Supervision of other normal pregnancy, antepartum 02/02/2016  . History of cesarean section 02/02/2016  . Hx of preeclampsia, prior pregnancy, currently pregnant 02/02/2016  . Oligomenorrhea 03/27/2013  . Overweight 03/27/2013  . Acne 03/27/2013   Objective:    BP 101/71   Pulse 88   Wt 221 lb (100.2 kg)   LMP 10/05/2015 (Approximate)   BMI 41.76 kg/m  FHT: 150 BPM  Uterine Size: size greater than dates     Assessment:    Pregnancy @ 8237w5d     Gestational Diabetic.  Suspect poorly compliant.  Needs to see Nutritionist and get testing supplies.  Plan:    Referred to Nutritionist for Diabetic Teaching  [facility]  Signs and symptoms of preterm labor: discussed. Smoking cessation discussed smokes unknown PPD.  Labs, problem list reviewed and updated Follow up in 2 weeks.  Patient ID: Kara Booker, female   DOB: Jan 04, 1991, 25 y.o.   MRN: [id number]

## 2016-04-28 NOTE — Progress Notes (Signed)
Pt complains of abdominal hernia causing pain She did not bring glucose log with her and she missed her nutrition/diabetes education appt

## 2016-05-12 ENCOUNTER — Encounter: Payer: Self-pay | Admitting: Obstetrics

## 2016-05-12 ENCOUNTER — Encounter: Payer: Medicaid Other | Attending: Obstetrics | Admitting: *Deleted

## 2016-05-12 ENCOUNTER — Ambulatory Visit (INDEPENDENT_AMBULATORY_CARE_PROVIDER_SITE_OTHER): Payer: Medicaid Other | Admitting: Obstetrics

## 2016-05-12 ENCOUNTER — Ambulatory Visit (HOSPITAL_COMMUNITY)
Admission: RE | Admit: 2016-05-12 | Discharge: 2016-05-12 | Disposition: A | Discharge status: Home / Self Care | Payer: Medicaid Other | Source: Ambulatory Visit | Attending: Family Medicine | Admitting: Family Medicine

## 2016-05-12 VITALS — BP 105/68 | HR 90 | Temp 98.3°F | Wt 223.5 lb

## 2016-05-12 DIAGNOSIS — O99810 Abnormal glucose complicating pregnancy: Secondary | ICD-10-CM

## 2016-05-12 DIAGNOSIS — O24419 Gestational diabetes mellitus in pregnancy, unspecified control: Secondary | ICD-10-CM | POA: Insufficient documentation

## 2016-05-12 DIAGNOSIS — O0993 Supervision of high risk pregnancy, unspecified, third trimester: Secondary | ICD-10-CM

## 2016-05-12 DIAGNOSIS — O24410 Gestational diabetes mellitus in pregnancy, diet controlled: Secondary | ICD-10-CM

## 2016-05-12 DIAGNOSIS — Z3A00 Weeks of gestation of pregnancy not specified: Secondary | ICD-10-CM | POA: Insufficient documentation

## 2016-05-12 NOTE — Progress Notes (Signed)
  Patient was seen on 05/12/2016 for Gestational Diabetes self-management.. The following learning objectives were met by the patient during this course:   States the definition of Gestational Diabetes  States why dietary management is important in controlling blood glucose  Describes the effects each nutrient has on blood glucose levels  Demonstrates ability to create a balanced meal plan  Demonstrates carbohydrate counting   States when to check blood glucose levels  Demonstrates proper blood glucose monitoring techniques  States the effect of stress and exercise on blood glucose levels  States the importance of limiting caffeine and abstaining from alcohol and smoking  Blood glucose monitor patient has is Accu Chek Guide:  She reports FBG in range of 90-106 mg/dl and post meal (30 minutes to 1 hour) @ 117-130 mg/dl. She states she has been testing since September. I encouraged her to bring her BG Log sheet to every MD and Diabetes appointment from now on. Also taught her to test at 2 hours after first bite of meal.    Patient instructed to monitor glucose levels: FBS: 60 - <95 2 hour: <120  *Patient received handouts:  Nutrition Diabetes and Pregnancy  Carbohydrate Counting List  Patient will be seen for follow-up as needed.

## 2016-05-12 NOTE — Progress Notes (Signed)
Subjective:    Kara Booker is a 25 y.o. female being seen today for her obstetrical visit. She is at 5854w5d gestation. Patient reports no complaints. Fetal movement: normal.  Problem List Items Addressed This Visit    None    Visit Diagnoses    Supervision of high risk pregnancy in third trimester    -  Primary   GDM, class A2       Relevant Orders   US MFM OB FOLLOW UP   Abnormal maternal glucose tolerance, antepartum       Relevant Orders   US MFM OB FOLLOW UP     Patient Active Problem List   Diagnosis Date Noted  . Gestational diabetes mellitus 04/28/2016  . Supervision of other normal pregnancy, antepartum 02/02/2016  . History of cesarean section 02/02/2016  . Hx of preeclampsia, prior pregnancy, currently pregnant 02/02/2016  . Oligomenorrhea 03/27/2013  . Overweight 03/27/2013  . Acne 03/27/2013   Objective:    BP 105/68   Pulse 90   Temp 98.3 F (36.8 C)   Wt 223 lb 8 oz (101.4 kg)   LMP 10/05/2015 (Approximate)   BMI 42.23 kg/m  FHT:  150 BPM  Uterine Size: size greater than dates  Presentation: unsure     Assessment:    Pregnancy @ 4254w5d weeks   Plan:     labs reviewed, problem list updated Consent signed. GBS sent TDAP offered  Rhogam given for RH negative Pediatrician: discussed. Infant feeding: plans to breastfeed. Maternity leave: discussed. Cigarette smoking: smokes 0.1 PPD. Orders Placed This Encounter  Procedures  . US MFM OB FOLLOW UP    Standing Status:   Future    Standing Expiration Date:   07/12/2017    Order Specific Question:   Reason for Exam (SYMPTOM  OR DIAGNOSIS REQUIRED)    Answer:   Interval growth    Order Specific Question:   Preferred imaging location?    Answer:   [facility]   No orders of the defined types were placed in this encounter.  Follow up in 2 Weeks.

## 2016-05-17 ENCOUNTER — Ambulatory Visit (HOSPITAL_COMMUNITY)
Admission: RE | Admit: 2016-05-17 | Discharge: 2016-05-17 | Disposition: A | Discharge status: Home / Self Care | Payer: Medicaid Other | Source: Ambulatory Visit | Attending: Obstetrics | Admitting: Obstetrics

## 2016-05-17 ENCOUNTER — Other Ambulatory Visit (HOSPITAL_COMMUNITY): Payer: Self-pay | Admitting: *Deleted

## 2016-05-17 ENCOUNTER — Encounter (HOSPITAL_COMMUNITY): Payer: Self-pay

## 2016-05-17 DIAGNOSIS — O24419 Gestational diabetes mellitus in pregnancy, unspecified control: Secondary | ICD-10-CM

## 2016-05-17 DIAGNOSIS — O24410 Gestational diabetes mellitus in pregnancy, diet controlled: Secondary | ICD-10-CM | POA: Insufficient documentation

## 2016-05-17 DIAGNOSIS — O99810 Abnormal glucose complicating pregnancy: Secondary | ICD-10-CM

## 2016-05-17 DIAGNOSIS — O99213 Obesity complicating pregnancy, third trimester: Secondary | ICD-10-CM | POA: Diagnosis not present

## 2016-05-17 DIAGNOSIS — Z3A30 30 weeks gestation of pregnancy: Secondary | ICD-10-CM | POA: Insufficient documentation

## 2016-05-17 DIAGNOSIS — O34219 Maternal care for unspecified type scar from previous cesarean delivery: Secondary | ICD-10-CM | POA: Insufficient documentation

## 2016-05-18 ENCOUNTER — Encounter: Payer: Self-pay | Admitting: *Deleted

## 2016-05-25 ENCOUNTER — Encounter: Payer: Medicaid Other | Admitting: Family Medicine

## 2016-06-14 ENCOUNTER — Ambulatory Visit (HOSPITAL_COMMUNITY)
Admission: RE | Admit: 2016-06-14 | Discharge: 2016-06-14 | Disposition: A | Discharge status: Home / Self Care | Payer: Medicaid Other | Source: Ambulatory Visit | Attending: Obstetrics | Admitting: Obstetrics

## 2016-06-14 ENCOUNTER — Encounter (HOSPITAL_COMMUNITY): Payer: Self-pay

## 2016-06-14 ENCOUNTER — Telehealth: Payer: Self-pay

## 2016-06-14 DIAGNOSIS — Z3A34 34 weeks gestation of pregnancy: Secondary | ICD-10-CM | POA: Insufficient documentation

## 2016-06-14 DIAGNOSIS — O34211 Maternal care for low transverse scar from previous cesarean delivery: Secondary | ICD-10-CM | POA: Insufficient documentation

## 2016-06-14 DIAGNOSIS — O24419 Gestational diabetes mellitus in pregnancy, unspecified control: Secondary | ICD-10-CM

## 2016-06-14 DIAGNOSIS — O24410 Gestational diabetes mellitus in pregnancy, diet controlled: Secondary | ICD-10-CM | POA: Insufficient documentation

## 2016-06-14 DIAGNOSIS — O99213 Obesity complicating pregnancy, third trimester: Secondary | ICD-10-CM | POA: Insufficient documentation

## 2016-06-14 NOTE — Telephone Encounter (Signed)
Returned call, patient stated that she needed a letter to submit to her employer.

## 2016-06-15 ENCOUNTER — Other Ambulatory Visit (HOSPITAL_COMMUNITY): Payer: Self-pay | Admitting: *Deleted

## 2016-06-15 DIAGNOSIS — O409XX0 Polyhydramnios, unspecified trimester, not applicable or unspecified: Secondary | ICD-10-CM

## 2016-06-21 ENCOUNTER — Ambulatory Visit (INDEPENDENT_AMBULATORY_CARE_PROVIDER_SITE_OTHER): Payer: Medicaid Other | Admitting: Obstetrics and Gynecology

## 2016-06-21 ENCOUNTER — Other Ambulatory Visit (HOSPITAL_COMMUNITY): Payer: Medicaid Other

## 2016-06-21 ENCOUNTER — Ambulatory Visit (HOSPITAL_COMMUNITY): Payer: Medicaid Other

## 2016-06-21 VITALS — BP 114/77 | HR 88 | Wt 226.0 lb

## 2016-06-21 DIAGNOSIS — Z98891 History of uterine scar from previous surgery: Secondary | ICD-10-CM

## 2016-06-21 DIAGNOSIS — O09299 Supervision of pregnancy with other poor reproductive or obstetric history, unspecified trimester: Secondary | ICD-10-CM

## 2016-06-21 DIAGNOSIS — O24410 Gestational diabetes mellitus in pregnancy, diet controlled: Secondary | ICD-10-CM

## 2016-06-21 MED ORDER — GLYBURIDE 2.5 MG PO TABS: 2.5000 mg | ORAL_TABLET | Freq: Every day | ORAL | 0 refills | Status: DC

## 2016-06-21 NOTE — Progress Notes (Signed)
Patient states she is having the normal pressure and pain she would expect during pregnancy- nothing that is worrisome to her.

## 2016-06-21 NOTE — Progress Notes (Signed)
Subjective:  Kara Booker is a 25 y.o. [phone] at 8542w3d being seen today for ongoing prenatal care.  She is currently monitored for the following issues for this high-risk pregnancy and has Overweight; Acne; Supervision of other normal pregnancy, antepartum; History of cesarean section; Hx of preeclampsia, prior pregnancy, currently pregnant; and Gestational diabetes mellitus on her problem list.  Patient reports no complaints.  Contractions: Not present. Vag. Bleeding: None.  Movement: Present. Denies leaking of fluid.   The following portions of the patient's history were reviewed and updated as appropriate: allergies, current medications, past family history, past medical history, past social history, past surgical history and problem list. Problem list updated.  Objective:   Vitals:   06/21/16 0855  BP: 114/77  Pulse: 88  Weight: 226 lb (102.5 kg)    Fetal Status: Fetal Heart Rate (bpm): 140   Movement: Present     General:  Alert, oriented and cooperative. Patient is in no acute distress.  Skin: Skin is warm and dry. No rash noted.   Cardiovascular: Normal heart rate noted  Respiratory: Normal respiratory effort, no problems with respiration noted  Abdomen: Soft, gravid, appropriate for gestational age. Pain/Pressure: Absent     Pelvic:  Cervical exam deferred        Extremities: Normal range of motion.  Edema: None  Mental Status: Normal mood and affect. Normal behavior. Normal judgment and thought content.   Urinalysis: Urine Protein: 1+ Urine Glucose: Negative  Assessment and Plan:  Pregnancy: [id number] at 6842w3d  1. Diet controlled gestational diabetes mellitus (GDM) in third trimester Several fasting BS and 2 hr PP dinner are not in range. Will start Glyburide.  Importance of following diet stressed to pt U/S 12/12 AC > 97 %, EFW 3420 gm 90% - Fetal nonstress test; Future - glyBURIDE (DIABETA) 2.5 MG tablet; Take 1 tablet (2.5 mg total) by mouth at bedtime.  Dispense:  30 tablet; Refill: 0 Continue with twice weekly antenatal testing 2. Hx of preeclampsia, prior pregnancy, currently pregnant Continue with BASA  3. History of cesarean section VBAC consent signed today. Successful VBAC in 2012  4. Polyhydramnios Weekly AFI Preterm labor symptoms and general obstetric precautions including but not limited to vaginal bleeding, contractions, leaking of fluid and fetal movement were reviewed in detail with the patient. Please refer to After Visit Summary for other counseling recommendations.  No Follow-up on file.   Hermina StaggersMichael L Aimar Shrewsbury, MD

## 2016-06-24 ENCOUNTER — Other Ambulatory Visit (HOSPITAL_COMMUNITY): Payer: Self-pay | Admitting: Maternal and Fetal Medicine

## 2016-06-24 ENCOUNTER — Ambulatory Visit (HOSPITAL_COMMUNITY)
Admission: RE | Admit: 2016-06-24 | Discharge: 2016-06-24 | Disposition: A | Discharge status: Home / Self Care | Payer: Medicaid Other | Source: Ambulatory Visit | Attending: Obstetrics and Gynecology | Admitting: Obstetrics and Gynecology

## 2016-06-24 ENCOUNTER — Encounter (HOSPITAL_COMMUNITY): Payer: Self-pay

## 2016-06-24 DIAGNOSIS — O403XX9 Polyhydramnios, third trimester, other fetus: Secondary | ICD-10-CM | POA: Diagnosis not present

## 2016-06-24 DIAGNOSIS — Z3A35 35 weeks gestation of pregnancy: Secondary | ICD-10-CM

## 2016-06-24 DIAGNOSIS — O409XX0 Polyhydramnios, unspecified trimester, not applicable or unspecified: Secondary | ICD-10-CM

## 2016-06-24 DIAGNOSIS — O24415 Gestational diabetes mellitus in pregnancy, controlled by oral hypoglycemic drugs: Secondary | ICD-10-CM

## 2016-06-24 DIAGNOSIS — O99213 Obesity complicating pregnancy, third trimester: Secondary | ICD-10-CM | POA: Insufficient documentation

## 2016-06-24 DIAGNOSIS — O24410 Gestational diabetes mellitus in pregnancy, diet controlled: Secondary | ICD-10-CM | POA: Insufficient documentation

## 2016-06-28 ENCOUNTER — Ambulatory Visit (INDEPENDENT_AMBULATORY_CARE_PROVIDER_SITE_OTHER): Payer: Medicaid Other | Admitting: Obstetrics and Gynecology

## 2016-06-28 ENCOUNTER — Other Ambulatory Visit (HOSPITAL_COMMUNITY)
Admission: RE | Admit: 2016-06-28 | Discharge: 2016-06-28 | Disposition: A | Discharge status: Home / Self Care | Payer: Medicaid Other | Source: Ambulatory Visit | Attending: Obstetrics and Gynecology | Admitting: Obstetrics and Gynecology

## 2016-06-28 ENCOUNTER — Ambulatory Visit (HOSPITAL_COMMUNITY): Payer: Medicaid Other

## 2016-06-28 ENCOUNTER — Other Ambulatory Visit (HOSPITAL_COMMUNITY): Payer: Medicaid Other

## 2016-06-28 VITALS — BP 122/79 | HR 85 | Wt 233.0 lb

## 2016-06-28 DIAGNOSIS — O24415 Gestational diabetes mellitus in pregnancy, controlled by oral hypoglycemic drugs: Secondary | ICD-10-CM

## 2016-06-28 DIAGNOSIS — Z113 Encounter for screening for infections with a predominantly sexual mode of transmission: Secondary | ICD-10-CM | POA: Insufficient documentation

## 2016-06-28 DIAGNOSIS — O09299 Supervision of pregnancy with other poor reproductive or obstetric history, unspecified trimester: Secondary | ICD-10-CM

## 2016-06-28 DIAGNOSIS — Z3480 Encounter for supervision of other normal pregnancy, unspecified trimester: Secondary | ICD-10-CM

## 2016-06-28 DIAGNOSIS — O09293 Supervision of pregnancy with other poor reproductive or obstetric history, third trimester: Secondary | ICD-10-CM

## 2016-06-28 DIAGNOSIS — O34219 Maternal care for unspecified type scar from previous cesarean delivery: Secondary | ICD-10-CM

## 2016-06-28 DIAGNOSIS — Z98891 History of uterine scar from previous surgery: Secondary | ICD-10-CM

## 2016-06-28 LAB — OB RESULTS CONSOLE GBS: STREP GROUP B AG: NEGATIVE

## 2016-06-28 NOTE — Progress Notes (Addendum)
   PRENATAL VISIT NOTE  Subjective:  Kara Booker is a 25 y.o. [phone] at 5369w3d being seen today for ongoing prenatal care.  She is currently monitored for the following issues for this high-risk pregnancy and has Overweight; Acne; Supervision of other normal pregnancy, antepartum; History of cesarean section; Hx of preeclampsia, prior pregnancy, currently pregnant; and Gestational diabetes mellitus on her problem list.  Patient reports no complaints.  Contractions: Irregular. Vag. Bleeding: None.  Movement: Present. Denies leaking of fluid.   The following portions of the patient's history were reviewed and updated as appropriate: allergies, current medications, past family history, past medical history, past social history, past surgical history and problem list. Problem list updated.  Objective:   Vitals:   06/28/16 1450  BP: 122/79  Pulse: 85  Weight: 233 lb (105.7 kg)    Fetal Status: Fetal Heart Rate (bpm): NST    Movement: Present  Presentation: Vertex  General:  Alert, oriented and cooperative. Patient is in no acute distress.  Skin: Skin is warm and dry. No rash noted.   Cardiovascular: Normal heart rate noted  Respiratory: Normal respiratory effort, no problems with respiration noted  Abdomen: Soft, gravid, appropriate for gestational age. Pain/Pressure: Absent     Pelvic:  Cervical exam performed Dilation: 1 Effacement (%): Thick Station: -3  Extremities: Normal range of motion.  Edema: None  Mental Status: Normal mood and affect. Normal behavior. Normal judgment and thought content.   Assessment and Plan:  Pregnancy: [id number] at 6869w3d  1. Supervision of other normal pregnancy, antepartum Patient is doing well Pelvic cultures collected - [facility] - Culture, beta strep (group b only)  2. History of cesarean section Desires TOLAC  3. Hx of preeclampsia, prior pregnancy, currently pregnant Continue daily ASA until 37  weeks  4. Gestational diabetes mellitus (GDM) in third trimester controlled on oral hypoglycemic drug Patient did not bring CBG log and reports fasting as high as 110 and pp in 120-130's range She has not started glyburide. Pharmacy contacted and Rx is available for pick up Follow up NSTs and Growth ultrasound scheduled - Fetal nonstress test- reviewed and reactive  Preterm labor symptoms and general obstetric precautions including but not limited to vaginal bleeding, contractions, leaking of fluid and fetal movement were reviewed in detail with the patient. Please refer to After Visit Summary for other counseling recommendations.  Return in about 1 week (around 07/05/2016) for ROB with NST.   Catalina AntiguaPeggy Bryony Kaman, MD

## 2016-06-30 LAB — GC/CHLAMYDIA PROBE AMP (~~LOC~~) NOT AT ARMC
Chlamydia: NEGATIVE
Neisseria Gonorrhea: NEGATIVE

## 2016-07-01 ENCOUNTER — Ambulatory Visit (HOSPITAL_COMMUNITY)
Admission: RE | Admit: 2016-07-01 | Discharge: 2016-07-01 | Disposition: A | Discharge status: Home / Self Care | Payer: Medicaid Other | Source: Ambulatory Visit | Attending: Obstetrics and Gynecology | Admitting: Obstetrics and Gynecology

## 2016-07-01 ENCOUNTER — Ambulatory Visit (HOSPITAL_COMMUNITY): Admission: RE | Admit: 2016-07-01 | Payer: Medicaid Other | Source: Ambulatory Visit

## 2016-07-01 DIAGNOSIS — Z3A36 36 weeks gestation of pregnancy: Secondary | ICD-10-CM | POA: Insufficient documentation

## 2016-07-01 DIAGNOSIS — O403XX0 Polyhydramnios, third trimester, not applicable or unspecified: Secondary | ICD-10-CM | POA: Insufficient documentation

## 2016-07-01 DIAGNOSIS — O409XX0 Polyhydramnios, unspecified trimester, not applicable or unspecified: Secondary | ICD-10-CM

## 2016-07-02 LAB — CULTURE, BETA STREP (GROUP B ONLY): STREP GP B CULTURE: NEGATIVE

## 2016-07-05 ENCOUNTER — Other Ambulatory Visit (HOSPITAL_COMMUNITY): Payer: Medicaid Other

## 2016-07-05 ENCOUNTER — Ambulatory Visit (INDEPENDENT_AMBULATORY_CARE_PROVIDER_SITE_OTHER): Payer: Self-pay | Admitting: Obstetrics and Gynecology

## 2016-07-05 VITALS — BP 123/87 | HR 91 | Temp 98.6°F | Wt 232.0 lb

## 2016-07-05 DIAGNOSIS — O24415 Gestational diabetes mellitus in pregnancy, controlled by oral hypoglycemic drugs: Secondary | ICD-10-CM

## 2016-07-05 DIAGNOSIS — Z98891 History of uterine scar from previous surgery: Secondary | ICD-10-CM

## 2016-07-05 DIAGNOSIS — O09299 Supervision of pregnancy with other poor reproductive or obstetric history, unspecified trimester: Secondary | ICD-10-CM

## 2016-07-05 DIAGNOSIS — Z3480 Encounter for supervision of other normal pregnancy, unspecified trimester: Secondary | ICD-10-CM

## 2016-07-05 NOTE — Patient Instructions (Signed)

## 2016-07-05 NOTE — Progress Notes (Signed)
Subjective:  Kara Booker is a 26 y.o. [phone] at 5574w3d being seen today for ongoing prenatal care.  She is currently monitored for the following issues for this high-risk pregnancy and has Overweight; Acne; Supervision of other normal pregnancy, antepartum; History of cesarean section; Hx of preeclampsia, prior pregnancy, currently pregnant; and Gestational diabetes mellitus on her problem list.  Patient reports no complaints.  Contractions: Irregular. Vag. Bleeding: None.  Movement: Present. Denies leaking of fluid.   The following portions of the patient's history were reviewed and updated as appropriate: allergies, current medications, past family history, past medical history, past social history, past surgical history and problem list. Problem list updated.  Objective:   Vitals:   07/05/16 1557  BP: 123/87  Pulse: 91  Temp: 98.6 F (37 C)  Weight: 232 lb (105.2 kg)    Fetal Status: Fetal Heart Rate (bpm): NST Fundal Height: 38 cm Movement: Present     General:  Alert, oriented and cooperative. Patient is in no acute distress.  Skin: Skin is warm and dry. No rash noted.   Cardiovascular: Normal heart rate noted  Respiratory: Normal respiratory effort, no problems with respiration noted  Abdomen: Soft, gravid, appropriate for gestational age. Pain/Pressure: Present     Pelvic:  Cervical exam deferred        Extremities: Normal range of motion.  Edema: Trace  Mental Status: Normal mood and affect. Normal behavior. Normal judgment and thought content.   Urinalysis:      Assessment and Plan:  Pregnancy: [id number] at 2274w3d  1. Supervision of other normal pregnancy, antepartum Labor precautions reviewed  2. Gestational diabetes mellitus (GDM) in third trimester controlled on oral hypoglycemic drug 2 hr PP not in goal range for last several weeks will increase Glyburide to bid Continue with antenatal testing Growth scan this week IOL at 39 wks  3. History of cesarean  section TOLAC  4. Hx of preeclampsia, prior pregnancy, currently pregnant Stable on BASA  Term labor symptoms and general obstetric precautions including but not limited to vaginal bleeding, contractions, leaking of fluid and fetal movement were reviewed in detail with the patient. Please refer to After Visit Summary for other counseling recommendations.  Return in about 1 week (around 07/12/2016) for OB visit.   Hermina StaggersMichael L Kenyetta Fife, MD

## 2016-07-05 NOTE — Progress Notes (Signed)
Patient states that she feels good, reports contractions on and off with good fetal movement.

## 2016-07-08 ENCOUNTER — Ambulatory Visit (HOSPITAL_COMMUNITY)
Admission: RE | Admit: 2016-07-08 | Discharge: 2016-07-08 | Disposition: A | Discharge status: Home / Self Care | Payer: Medicaid Other | Source: Ambulatory Visit | Attending: Obstetrics and Gynecology | Admitting: Obstetrics and Gynecology

## 2016-07-08 ENCOUNTER — Encounter (HOSPITAL_COMMUNITY): Payer: Self-pay

## 2016-07-08 DIAGNOSIS — Z3A37 37 weeks gestation of pregnancy: Secondary | ICD-10-CM | POA: Insufficient documentation

## 2016-07-08 DIAGNOSIS — O24415 Gestational diabetes mellitus in pregnancy, controlled by oral hypoglycemic drugs: Secondary | ICD-10-CM

## 2016-07-08 DIAGNOSIS — O409XX0 Polyhydramnios, unspecified trimester, not applicable or unspecified: Secondary | ICD-10-CM

## 2016-07-08 DIAGNOSIS — O403XX0 Polyhydramnios, third trimester, not applicable or unspecified: Secondary | ICD-10-CM | POA: Insufficient documentation

## 2016-07-12 ENCOUNTER — Ambulatory Visit (INDEPENDENT_AMBULATORY_CARE_PROVIDER_SITE_OTHER): Payer: Self-pay | Admitting: Obstetrics and Gynecology

## 2016-07-12 ENCOUNTER — Other Ambulatory Visit: Payer: Self-pay | Admitting: Obstetrics and Gynecology

## 2016-07-12 ENCOUNTER — Telehealth (HOSPITAL_COMMUNITY): Payer: Self-pay | Admitting: *Deleted

## 2016-07-12 ENCOUNTER — Other Ambulatory Visit (HOSPITAL_COMMUNITY): Payer: Medicaid Other

## 2016-07-12 ENCOUNTER — Ambulatory Visit (HOSPITAL_COMMUNITY): Payer: Medicaid Other

## 2016-07-12 ENCOUNTER — Other Ambulatory Visit (INDEPENDENT_AMBULATORY_CARE_PROVIDER_SITE_OTHER): Payer: Self-pay

## 2016-07-12 VITALS — BP 124/80 | HR 77 | Wt 232.8 lb

## 2016-07-12 DIAGNOSIS — Z98891 History of uterine scar from previous surgery: Secondary | ICD-10-CM

## 2016-07-12 DIAGNOSIS — O09299 Supervision of pregnancy with other poor reproductive or obstetric history, unspecified trimester: Secondary | ICD-10-CM

## 2016-07-12 DIAGNOSIS — O24415 Gestational diabetes mellitus in pregnancy, controlled by oral hypoglycemic drugs: Secondary | ICD-10-CM

## 2016-07-12 DIAGNOSIS — O409XX0 Polyhydramnios, unspecified trimester, not applicable or unspecified: Secondary | ICD-10-CM

## 2016-07-12 DIAGNOSIS — O34219 Maternal care for unspecified type scar from previous cesarean delivery: Secondary | ICD-10-CM

## 2016-07-12 DIAGNOSIS — Z3480 Encounter for supervision of other normal pregnancy, unspecified trimester: Secondary | ICD-10-CM

## 2016-07-12 NOTE — Telephone Encounter (Signed)
Preadmission screen  

## 2016-07-12 NOTE — Progress Notes (Signed)
Subjective:  Kara Booker is a 26 y.o. [phone] at 3225w3d being seen today for ongoing prenatal care.  She is currently monitored for the following issues for this high-risk pregnancy and has Overweight; Acne; Supervision of other normal pregnancy, antepartum; History of cesarean section; Hx of preeclampsia, prior pregnancy, currently pregnant; and Gestational diabetes mellitus on her problem list.  Patient reports no complaints.  Contractions: Not present. Vag. Bleeding: None.  Movement: Present. Denies leaking of fluid.   The following portions of the patient's history were reviewed and updated as appropriate: allergies, current medications, past family history, past medical history, past social history, past surgical history and problem list. Problem list updated.  Objective:   Vitals:   07/12/16 1518  BP: 124/80  Pulse: 77  Weight: 232 lb 12.8 oz (105.6 kg)    Fetal Status: Fetal Heart Rate (bpm): NST   Movement: Present     General:  Alert, oriented and cooperative. Patient is in no acute distress.  Skin: Skin is warm and dry. No rash noted.   Cardiovascular: Normal heart rate noted  Respiratory: Normal respiratory effort, no problems with respiration noted  Abdomen: Soft, gravid, appropriate for gestational age. Pain/Pressure: Absent     Pelvic:  Cervical exam deferred        Extremities: Normal range of motion.  Edema: None  Mental Status: Normal mood and affect. Normal behavior. Normal judgment and thought content.   Urinalysis:      Assessment and Plan:  Pregnancy: [id number] at 7025w3d  1. Hx of preeclampsia, prior pregnancy, currently pregnant BP stable On BASA   2. History of cesarean section TOLAC  3. Supervision of other normal pregnancy, antepartum   4. Gestational diabetes mellitus (GDM) in third trimester controlled on oral hypoglycemic drug BS in goal range with bid Glyburide IOL this Saturday  Term labor symptoms and general obstetric precautions  including but not limited to vaginal bleeding, contractions, leaking of fluid and fetal movement were reviewed in detail with the patient. Please refer to After Visit Summary for other counseling recommendations.  Return in about 6 weeks (around 08/23/2016).   Hermina StaggersMichael L Yajahira Tison, MD

## 2016-07-12 NOTE — Patient Instructions (Signed)

## 2016-07-12 NOTE — Addendum Note (Signed)
Addended by: Natale MilchSTALLING, Javontae Marlette D on: 07/12/2016 04:16 PM   Modules accepted: Orders

## 2016-07-12 NOTE — Progress Notes (Signed)
Patient is in the office, reports good fetal movement. 

## 2016-07-15 ENCOUNTER — Other Ambulatory Visit (HOSPITAL_COMMUNITY): Payer: Self-pay | Admitting: Maternal and Fetal Medicine

## 2016-07-15 ENCOUNTER — Ambulatory Visit (HOSPITAL_COMMUNITY)
Admission: RE | Admit: 2016-07-15 | Discharge: 2016-07-15 | Disposition: A | Discharge status: Home / Self Care | Payer: Medicaid Other | Source: Ambulatory Visit | Attending: Obstetrics and Gynecology | Admitting: Obstetrics and Gynecology

## 2016-07-15 ENCOUNTER — Encounter (HOSPITAL_COMMUNITY): Payer: Self-pay

## 2016-07-15 ENCOUNTER — Other Ambulatory Visit: Payer: Self-pay | Admitting: Advanced Practice Midwife

## 2016-07-15 ENCOUNTER — Ambulatory Visit (HOSPITAL_COMMUNITY): Admission: RE | Admit: 2016-07-15 | Payer: Medicaid Other | Source: Ambulatory Visit

## 2016-07-15 DIAGNOSIS — O99213 Obesity complicating pregnancy, third trimester: Secondary | ICD-10-CM

## 2016-07-15 DIAGNOSIS — Z3A38 38 weeks gestation of pregnancy: Secondary | ICD-10-CM

## 2016-07-15 DIAGNOSIS — O409XX0 Polyhydramnios, unspecified trimester, not applicable or unspecified: Secondary | ICD-10-CM

## 2016-07-15 DIAGNOSIS — O24415 Gestational diabetes mellitus in pregnancy, controlled by oral hypoglycemic drugs: Secondary | ICD-10-CM | POA: Insufficient documentation

## 2016-07-15 DIAGNOSIS — O34211 Maternal care for low transverse scar from previous cesarean delivery: Secondary | ICD-10-CM

## 2016-07-15 DIAGNOSIS — O24419 Gestational diabetes mellitus in pregnancy, unspecified control: Secondary | ICD-10-CM

## 2016-07-16 ENCOUNTER — Inpatient Hospital Stay (HOSPITAL_COMMUNITY): Payer: Medicaid Other | Admitting: Anesthesiology

## 2016-07-16 ENCOUNTER — Encounter (HOSPITAL_COMMUNITY): Payer: Self-pay

## 2016-07-16 ENCOUNTER — Inpatient Hospital Stay (HOSPITAL_COMMUNITY)
Admission: RE | Admit: 2016-07-16 | Discharge: 2016-07-20 | DRG: 765 | Disposition: A | Discharge status: Home / Self Care | Payer: Medicaid Other | Source: Ambulatory Visit | Attending: Family Medicine | Admitting: Family Medicine

## 2016-07-16 VITALS — BP 111/72 | HR 69 | Temp 98.2°F | Resp 18 | Ht 61.0 in | Wt 234.0 lb

## 2016-07-16 DIAGNOSIS — O3663X0 Maternal care for excessive fetal growth, third trimester, not applicable or unspecified: Secondary | ICD-10-CM | POA: Diagnosis present

## 2016-07-16 DIAGNOSIS — Z7982 Long term (current) use of aspirin: Secondary | ICD-10-CM | POA: Diagnosis not present

## 2016-07-16 DIAGNOSIS — Z6841 Body Mass Index (BMI) 40.0 and over, adult: Secondary | ICD-10-CM

## 2016-07-16 DIAGNOSIS — O24425 Gestational diabetes mellitus in childbirth, controlled by oral hypoglycemic drugs: Principal | ICD-10-CM | POA: Diagnosis present

## 2016-07-16 DIAGNOSIS — Z833 Family history of diabetes mellitus: Secondary | ICD-10-CM | POA: Diagnosis not present

## 2016-07-16 DIAGNOSIS — O99214 Obesity complicating childbirth: Secondary | ICD-10-CM | POA: Diagnosis present

## 2016-07-16 DIAGNOSIS — O24415 Gestational diabetes mellitus in pregnancy, controlled by oral hypoglycemic drugs: Secondary | ICD-10-CM

## 2016-07-16 DIAGNOSIS — Z3A39 39 weeks gestation of pregnancy: Secondary | ICD-10-CM | POA: Diagnosis not present

## 2016-07-16 DIAGNOSIS — Z87891 Personal history of nicotine dependence: Secondary | ICD-10-CM | POA: Diagnosis not present

## 2016-07-16 DIAGNOSIS — D62 Acute posthemorrhagic anemia: Secondary | ICD-10-CM | POA: Diagnosis not present

## 2016-07-16 DIAGNOSIS — O24419 Gestational diabetes mellitus in pregnancy, unspecified control: Secondary | ICD-10-CM

## 2016-07-16 DIAGNOSIS — Z98891 History of uterine scar from previous surgery: Secondary | ICD-10-CM

## 2016-07-16 DIAGNOSIS — O34211 Maternal care for low transverse scar from previous cesarean delivery: Secondary | ICD-10-CM | POA: Diagnosis present

## 2016-07-16 DIAGNOSIS — O9081 Anemia of the puerperium: Secondary | ICD-10-CM | POA: Diagnosis not present

## 2016-07-16 LAB — CBC
HCT: 31 % — ABNORMAL LOW (ref 36.0–46.0)
Hemoglobin: 10.4 g/dL — ABNORMAL LOW (ref 12.0–15.0)
MCH: 24.8 pg — ABNORMAL LOW (ref 26.0–34.0)
MCHC: 33.5 g/dL (ref 30.0–36.0)
MCV: 73.8 fL — AB (ref 78.0–100.0)
PLATELETS: 307 10*3/uL (ref 150–400)
RBC: 4.2 MIL/uL (ref 3.87–5.11)
RDW: 14.8 % (ref 11.5–15.5)
WBC: 9.3 10*3/uL (ref 4.0–10.5)

## 2016-07-16 LAB — RPR: RPR Ser Ql: NONREACTIVE

## 2016-07-16 LAB — ABO/RH: ABO/RH(D): O POS

## 2016-07-16 LAB — GLUCOSE, CAPILLARY
GLUCOSE-CAPILLARY: 103 mg/dL — AB (ref 65–99)
GLUCOSE-CAPILLARY: 92 mg/dL (ref 65–99)
GLUCOSE-CAPILLARY: 71 mg/dL (ref 65–99)
Glucose-Capillary: 73 mg/dL (ref 65–99)
Glucose-Capillary: 78 mg/dL (ref 65–99)

## 2016-07-16 MED ORDER — PHENYLEPHRINE 40 MCG/ML (10ML) SYRINGE FOR IV PUSH (FOR BLOOD PRESSURE SUPPORT): 80.0000 ug | PREFILLED_SYRINGE | INTRAVENOUS | Status: DC | PRN

## 2016-07-16 MED ORDER — OXYTOCIN 40 UNITS IN LACTATED RINGERS INFUSION - SIMPLE MED
2.5000 [IU]/h | INTRAVENOUS | Status: DC
  Administered 2016-07-17: 2.5 [IU]/h via INTRAVENOUS
  Filled 2016-07-16: qty 1000

## 2016-07-16 MED ORDER — TERBUTALINE SULFATE 1 MG/ML IJ SOLN: 0.2500 mg | Freq: Once | INTRAMUSCULAR | Status: DC | PRN

## 2016-07-16 MED ORDER — DIPHENHYDRAMINE HCL 50 MG/ML IJ SOLN: 12.5000 mg | INTRAMUSCULAR | Status: DC | PRN

## 2016-07-16 MED ORDER — FENTANYL CITRATE (PF) 100 MCG/2ML IJ SOLN
100.0000 ug | INTRAMUSCULAR | Status: DC | PRN
  Administered 2016-07-16: 100 ug via INTRAVENOUS
  Administered 2016-07-17 (×2): 50 ug via INTRAVENOUS
  Filled 2016-07-16: qty 2

## 2016-07-16 MED ORDER — EPHEDRINE 5 MG/ML INJ: 10.0000 mg | INTRAVENOUS | Status: DC | PRN

## 2016-07-16 MED ORDER — LIDOCAINE HCL (PF) 1 % IJ SOLN
INTRAMUSCULAR | Status: DC | PRN
  Administered 2016-07-16 (×2): 5 mL via EPIDURAL

## 2016-07-16 MED ORDER — FENTANYL 2.5 MCG/ML BUPIVACAINE 1/10 % EPIDURAL INFUSION (WH - ANES)
14.0000 mL/h | INTRAMUSCULAR | Status: DC | PRN
  Administered 2016-07-16 – 2016-07-17 (×3): 14 mL/h via EPIDURAL
  Filled 2016-07-16 (×3): qty 100

## 2016-07-16 MED ORDER — OXYTOCIN 40 UNITS IN LACTATED RINGERS INFUSION - SIMPLE MED: 1.0000 m[IU]/min | INTRAVENOUS | Status: DC

## 2016-07-16 MED ORDER — LACTATED RINGERS IV SOLN: 500.0000 mL | Freq: Once | INTRAVENOUS | Status: DC

## 2016-07-16 MED ORDER — OXYCODONE-ACETAMINOPHEN 5-325 MG PO TABS: 2.0000 | ORAL_TABLET | ORAL | Status: DC | PRN

## 2016-07-16 MED ORDER — FENTANYL CITRATE (PF) 100 MCG/2ML IJ SOLN: 100.0000 ug | Freq: Once | INTRAMUSCULAR | Status: DC

## 2016-07-16 MED ORDER — LIDOCAINE HCL (PF) 1 % IJ SOLN: 30.0000 mL | INTRAMUSCULAR | Status: DC | PRN

## 2016-07-16 MED ORDER — OXYTOCIN BOLUS FROM INFUSION: 500.0000 mL | Freq: Once | INTRAVENOUS | Status: DC

## 2016-07-16 MED ORDER — LACTATED RINGERS IV SOLN
INTRAVENOUS | Status: DC
  Administered 2016-07-16 – 2016-07-17 (×5): via INTRAVENOUS

## 2016-07-16 MED ORDER — OXYTOCIN 40 UNITS IN LACTATED RINGERS INFUSION - SIMPLE MED
1.0000 m[IU]/min | INTRAVENOUS | Status: DC
  Administered 2016-07-16: 1 m[IU]/min via INTRAVENOUS

## 2016-07-16 MED ORDER — SOD CITRATE-CITRIC ACID 500-334 MG/5ML PO SOLN
30.0000 mL | ORAL | Status: DC | PRN
  Administered 2016-07-17: 30 mL via ORAL
  Filled 2016-07-16: qty 15

## 2016-07-16 MED ORDER — ONDANSETRON HCL 4 MG/2ML IJ SOLN
4.0000 mg | Freq: Four times a day (QID) | INTRAMUSCULAR | Status: DC | PRN
  Administered 2016-07-17 (×2): 4 mg via INTRAVENOUS
  Filled 2016-07-16: qty 2

## 2016-07-16 MED ORDER — ACETAMINOPHEN 325 MG PO TABS
650.0000 mg | ORAL_TABLET | ORAL | Status: DC | PRN
  Administered 2016-07-17: 1000 mg via ORAL
  Administered 2016-07-17: 650 mg via ORAL
  Filled 2016-07-16: qty 2

## 2016-07-16 MED ORDER — OXYCODONE-ACETAMINOPHEN 5-325 MG PO TABS
1.0000 | ORAL_TABLET | ORAL | Status: DC | PRN
Start: 2016-07-16 — End: 2016-07-17

## 2016-07-16 MED ORDER — LACTATED RINGERS IV SOLN
500.0000 mL | INTRAVENOUS | Status: DC | PRN
  Administered 2016-07-16: 500 mL via INTRAVENOUS
  Administered 2016-07-17: 250 mL via INTRAVENOUS

## 2016-07-16 MED ORDER — PHENYLEPHRINE 40 MCG/ML (10ML) SYRINGE FOR IV PUSH (FOR BLOOD PRESSURE SUPPORT)
80.0000 ug | PREFILLED_SYRINGE | INTRAVENOUS | Status: DC | PRN
  Filled 2016-07-16 (×2): qty 10

## 2016-07-16 NOTE — Anesthesia Procedure Notes (Signed)
Epidural Patient location during procedure: OB Start time: 07/16/2016 8:25 PM End time: 07/16/2016 8:39 PM  Staffing Anesthesiologist: Heather RobertsSINGER, Drue Harr Performed: anesthesiologist   Preanesthetic Checklist Completed: patient identified, site marked, pre-op evaluation, timeout performed, IV checked, risks and benefits discussed and monitors and equipment checked  Epidural Patient position: sitting Prep: DuraPrep Patient monitoring: heart rate, cardiac monitor, continuous pulse ox and blood pressure Approach: midline Location: L2-L3 Injection technique: LOR saline  Needle:  Needle type: Tuohy  Needle gauge: 17 G Needle length: 9 cm Needle insertion depth: 8 cm Catheter size: 20 Guage Catheter at skin depth: 13 cm Test dose: negative and Other  Assessment Events: blood not aspirated, injection not painful, no injection resistance and negative IV test  Additional Notes Informed consent obtained prior to proceeding including risk of failure, 1% risk of PDPH, risk of minor discomfort and bruising.  Discussed rare but serious complications including epidural abscess, permanent nerve injury, epidural hematoma.  Discussed alternatives to epidural analgesia and patient desires to proceed.  Timeout performed pre-procedure verifying patient name, procedure, and platelet count.  Patient tolerated procedure well. Difficult placement.

## 2016-07-16 NOTE — H&P (Signed)
LABOR AND DELIVERY ADMISSION HISTORY AND PHYSICAL NOTE  Kara Booker is a 26 y.o. female [phone] with IUP at 5339w0d by 10 wk ultrasound presenting for IOL for GDM A@. Patient currently on glyburide 2.5 BID. She reprts fasting glucose have been under 90, but post prandial are usually near 130. She has no other complaints today.   She reports positive fetal movement. She denies leakage of fluid or vaginal bleeding.  Prenatal History/Complications:  Past Medical History: Past Medical History:  Diagnosis Date  . Anemia   . Gestational diabetes mellitus 04/28/2016  . Kidney stones   . Kidney stones   . NVD (normal vaginal delivery) 01/17/2011  . Pregnancy induced hypertension     Past Surgical History: Past Surgical History:  Procedure Laterality Date  . CESAREAN SECTION    . KIDNEY STONE SURGERY      Obstetrical History: OB History    Gravida Para Term Preterm AB Living   4 2 2   1 2    SAB TAB Ectopic Multiple Live Births   1       2      Social History: Social History   Social History  . Marital status: Single    Spouse name: N/A  . Number of children: N/A  . Years of education: N/A   Social History Main Topics  . Smoking status: Former Smoker    Packs/day: 0.10    Types: Cigarettes    Quit date: 12/16/2010  . Smokeless tobacco: Never Used  . Alcohol use No     Comment: occ  . Drug use: No  . Sexual activity: Yes   Other Topics Concern  . None   Social History Narrative  . None    Family History: Family History  Problem Relation Age of Onset  . Hyperlipidemia Maternal Uncle   . Diabetes Maternal Uncle   . Cancer Maternal Uncle     LEUKEMIA  . Hyperlipidemia Mother   . Hyperlipidemia Father   . Hyperlipidemia Paternal Uncle   . Diabetes Paternal Uncle   . Cancer Cousin     Allergies: No Known Allergies  Prescriptions Prior to Admission  Medication Sig Dispense Refill Last Dose  . aspirin EC 81 MG tablet Take 81 mg by mouth daily.   07/16/2016  at 0630  . glyBURIDE (DIABETA) 2.5 MG tablet Take 2.5 mg by mouth daily with breakfast.   07/16/2016 at Unknown time  . Prenat-FeAsp-Meth-FA-DHA w/o A (PRENATE PIXIE) 10-0.6-0.4-200 MG CAPS Take 1 capsule by mouth daily.   07/16/2016 at Unknown time     Review of Systems   All systems reviewed and negative except as stated in HPI  Blood pressure (!) 149/87, pulse 68, temperature 98.7 F (37.1 C), temperature source Oral, resp. rate 20, height 5\' 1"  (1.549 m), weight 234 lb (106.1 kg), last menstrual period 10/05/2015. General appearance: alert, cooperative and appears stated age Lungs: clear to auscultation bilaterally Heart: regular rate and rhythm Abdomen: soft, non-tender; bowel sounds normal Extremities: No calf swelling or tenderness Presentation: cephalic by MD exam Fetal monitoring: category 1 Uterine activity: no contractions on admission Dilation: 1 Effacement (%): 50 Station: Ballotable Exam by:: dr Ronya Gilcrest   Prenatal labs: ABO, Rh: --/--/O POS, O POS (01/13 0820) Antibody: NEG (01/13 0820) Rubella: !Error! RPR: Non Reactive (01/13 0820)  HBsAg: Negative (08/04 1045)  HIV: Non Reactive (08/04 1045)  GBS: Negative (12/26 0000)  Anatomy US: normal  Prenatal Transfer Tool  Maternal Diabetes: Yes:  Diabetes Type:  Insulin/Medication controlled Genetic Screening: Normal Maternal Ultrasounds/Referrals: Normal Fetal Ultrasounds or other Referrals:  None Maternal Substance Abuse:  No Significant Maternal Medications:  Meds include: Other:  glyburide Significant Maternal Lab Results: Lab values include: Group B Strep negative  Results for orders placed or performed during the hospital encounter of 07/16/16 (from the past 24 hour(s))  CBC   Collection Time: 07/16/16  8:20 AM  Result Value Ref Range   WBC 9.3 4.0 - 10.5 K/uL   RBC 4.20 3.87 - 5.11 MIL/uL   Hemoglobin 10.4 (L) 12.0 - 15.0 g/dL   HCT 16.1 (L) 09.6 - 04.5 %   MCV 73.8 (L) 78.0 - 100.0 fL   MCH 24.8 (L)  26.0 - 34.0 pg   MCHC 33.5 30.0 - 36.0 g/dL   RDW 40.9 81.1 - 91.4 %   Platelets 307 150 - 400 K/uL  RPR   Collection Time: 07/16/16  8:20 AM  Result Value Ref Range   RPR Ser Ql Non Reactive Non Reactive  Type and screen   Collection Time: 07/16/16  8:20 AM  Result Value Ref Range   ABO/RH(D) O POS    Antibody Screen NEG    Sample Expiration 07/19/2016   ABO/Rh   Collection Time: 07/16/16  8:20 AM  Result Value Ref Range   ABO/RH(D) O POS   Glucose, capillary   Collection Time: 07/16/16  8:58 AM  Result Value Ref Range   Glucose-Capillary 103 (H) 65 - 99 mg/dL  Glucose, capillary   Collection Time: 07/16/16  1:10 PM  Result Value Ref Range   Glucose-Capillary 92 65 - 99 mg/dL  Glucose, capillary   Collection Time: 07/16/16  2:34 PM  Result Value Ref Range   Glucose-Capillary 73 65 - 99 mg/dL    Patient Active Problem List   Diagnosis Date Noted  . GDM, class A2 07/16/2016  . Gestational diabetes mellitus 04/28/2016  . Supervision of other normal pregnancy, antepartum 02/02/2016  . History of cesarean section 02/02/2016  . Hx of preeclampsia, prior pregnancy, currently pregnant 02/02/2016  . Overweight 03/27/2013  . Acne 03/27/2013    Assessment: Kara Booker is a 26 y.o. [id number] at [redacted]w[redacted]d here for IOL for GDMA2  #Labor:Patient wished to attempt VBAC, last VBAC was for 7lb child she believes this baby is much larger. [place] put EFW >10lbs. Patient still wishes to attempt VBAC. Will start with foley and low dose pitocin. 1x1 with max of 6 #GDMA2: Hold glyburide as patient is NPO. Monitor CBG q4 hours.  #Pain: IV pain medication then epirudal #FWB: Category 1 #ID:  GBS negative #MOF: breast #MOC:depo #Circ:  Girl   Ernestina Penna 07/16/2016, 3:40 PM

## 2016-07-16 NOTE — Progress Notes (Signed)
Patient seen doing well. Foley balloon removed. Cervix 4/50/-3. May continue pitocin at this time. Increase 2x2.

## 2016-07-16 NOTE — Anesthesia Preprocedure Evaluation (Addendum)
Anesthesia Evaluation  Patient identified by MRN, date of birth, ID band Patient awake and Patient confused    Reviewed: Allergy & Precautions, H&P , Patient's Chart, lab work & pertinent test results  History of Anesthesia Complications (+) PONV  Airway Mallampati: III  TM Distance: <3 FB Neck ROM: Full  Mouth opening: Limited Mouth Opening  Dental no notable dental hx. (+) Teeth Intact, Dental Advisory Given   Pulmonary neg pulmonary ROS, former smoker,    Pulmonary exam normal breath sounds clear to auscultation       Cardiovascular hypertension, negative cardio ROS Normal cardiovascular exam Rhythm:Regular Rate:Normal     Neuro/Psych negative neurological ROS  negative psych ROS   GI/Hepatic negative GI ROS, Neg liver ROS,   Endo/Other  diabetesMorbid obesity  Renal/GU negative Renal ROS     Musculoskeletal negative musculoskeletal ROS (+)   Abdominal Normal abdominal exam  (+)   Peds  Hematology negative hematology ROS (+)   Anesthesia Other Findings   Reproductive/Obstetrics (+) Pregnancy (For repeat C-section due to maternal fever/ fetal tachycardia.)                            Anesthesia Physical  Anesthesia Plan  ASA: III  Anesthesia Plan: Epidural   Post-op Pain Management:    Induction:   Airway Management Planned: Natural Airway  Additional Equipment:   Intra-op Plan:   Post-operative Plan:   Informed Consent: I have reviewed the patients History and Physical, chart, labs and discussed the procedure including the risks, benefits and alternatives for the proposed anesthesia with the patient or authorized representative who has indicated his/her understanding and acceptance.     Plan Discussed with: Anesthesiologist  Anesthesia Plan Comments: (Previous wet tap requiring EBP (2012).)       Anesthesia Quick Evaluation

## 2016-07-16 NOTE — Progress Notes (Signed)
Jouri Sondra ComeCruz is a 26 y.o. [id number] at 4747w0d by ultrasound admitted for induction of labor due to Gestational diabetes.  Subjective: Patient comfortable with her epidural. Feeling a little pressure in her bottom.  Objective: BP (!) 117/100   Pulse 78   Temp 98.7 F (37.1 C) (Oral)   Resp 16   Ht 5\' 1"  (1.549 m)   Wt 106.1 kg (234 lb)   LMP 10/05/2015 (Approximate)   SpO2 97%   BMI 44.21 kg/m  No intake/output data recorded. No intake/output data recorded.  FHT:  FHR: 135 bpm, variability: minimal ,  accelerations:  Present,  decelerations:  Absent UC:   regular, every 2-4 minutes SVE:   Dilation: 4 Effacement (%): 70 Station: -2 Exam by:: Laural RoesB. Parks, RN  Labs: Lab Results  Component Value Date   WBC 9.3 07/16/2016   HGB 10.4 (L) 07/16/2016   HCT 31.0 (L) 07/16/2016   MCV 73.8 (L) 07/16/2016   PLT 307 07/16/2016    Assessment / Plan: Induction of labor due to gestational diabetes,  progressing well on pitocin  Labor: On 12 of Pitocin, cervix is unchanged from last check. Station still high (-2) and barely able to feel BOW. Will continue to increase Pit then AROM.  Fetal Wellbeing:  Category I Pain Control:  Epidural I/D:  GBS neg Anticipated MOD:  NSVD  Beaulah DinningChristina M Rocklin Soderquist 07/16/2016, 9:26 PM

## 2016-07-16 NOTE — Progress Notes (Signed)
Given 4 ounces of cranberry juice for 73 blood sugar and c/o headache.

## 2016-07-17 ENCOUNTER — Encounter (HOSPITAL_COMMUNITY)
Admission: RE | Disposition: A | Discharge status: Home / Self Care | Payer: Self-pay | Source: Ambulatory Visit | Attending: Family Medicine

## 2016-07-17 ENCOUNTER — Encounter (HOSPITAL_COMMUNITY): Payer: Self-pay

## 2016-07-17 DIAGNOSIS — O3663X0 Maternal care for excessive fetal growth, third trimester, not applicable or unspecified: Secondary | ICD-10-CM

## 2016-07-17 DIAGNOSIS — O24425 Gestational diabetes mellitus in childbirth, controlled by oral hypoglycemic drugs: Secondary | ICD-10-CM

## 2016-07-17 DIAGNOSIS — O34211 Maternal care for low transverse scar from previous cesarean delivery: Secondary | ICD-10-CM

## 2016-07-17 DIAGNOSIS — Z3A39 39 weeks gestation of pregnancy: Secondary | ICD-10-CM

## 2016-07-17 LAB — GLUCOSE, CAPILLARY
GLUCOSE-CAPILLARY: 82 mg/dL (ref 65–99)
GLUCOSE-CAPILLARY: 84 mg/dL (ref 65–99)
GLUCOSE-CAPILLARY: 85 mg/dL (ref 65–99)
Glucose-Capillary: 79 mg/dL (ref 65–99)
Glucose-Capillary: 105 mg/dL — ABNORMAL HIGH (ref 65–99)

## 2016-07-17 SURGERY — Surgical Case
Anesthesia: Epidural | Site: Abdomen | Wound class: Clean Contaminated

## 2016-07-17 MED ORDER — METHYLERGONOVINE MALEATE 0.2 MG PO TABS: 0.2000 mg | ORAL_TABLET | ORAL | Status: DC | PRN

## 2016-07-17 MED ORDER — NALBUPHINE HCL 10 MG/ML IJ SOLN: 5.0000 mg | Freq: Once | INTRAMUSCULAR | Status: DC | PRN

## 2016-07-17 MED ORDER — SODIUM CHLORIDE 0.9 % IR SOLN
Status: DC | PRN
  Administered 2016-07-17: 1

## 2016-07-17 MED ORDER — MENTHOL 3 MG MT LOZG: 1.0000 | LOZENGE | OROMUCOSAL | Status: DC | PRN

## 2016-07-17 MED ORDER — KETOROLAC TROMETHAMINE 30 MG/ML IJ SOLN: 30.0000 mg | Freq: Four times a day (QID) | INTRAMUSCULAR | Status: DC | PRN

## 2016-07-17 MED ORDER — GENTAMICIN SULFATE 40 MG/ML IJ SOLN
140.0000 mg | Freq: Three times a day (TID) | INTRAVENOUS | Status: DC
  Filled 2016-07-17 (×2): qty 3.5

## 2016-07-17 MED ORDER — ERYTHROMYCIN 5 MG/GM OP OINT
TOPICAL_OINTMENT | OPHTHALMIC | Status: AC
  Filled 2016-07-17: qty 1

## 2016-07-17 MED ORDER — MORPHINE SULFATE-NACL 0.5-0.9 MG/ML-% IV SOSY
PREFILLED_SYRINGE | INTRAVENOUS | Status: DC | PRN
  Administered 2016-07-17: 4 mg via EPIDURAL

## 2016-07-17 MED ORDER — ACETAMINOPHEN 500 MG PO TABS: 1000.0000 mg | ORAL_TABLET | Freq: Once | ORAL | Status: DC

## 2016-07-17 MED ORDER — TETANUS-DIPHTH-ACELL PERTUSSIS 5-2.5-18.5 LF-MCG/0.5 IM SUSP: 0.5000 mL | Freq: Once | INTRAMUSCULAR | Status: DC

## 2016-07-17 MED ORDER — SODIUM CHLORIDE 0.9% FLUSH: 3.0000 mL | INTRAVENOUS | Status: DC | PRN

## 2016-07-17 MED ORDER — ONDANSETRON HCL 4 MG/2ML IJ SOLN
INTRAMUSCULAR | Status: AC
  Filled 2016-07-17: qty 2

## 2016-07-17 MED ORDER — COCONUT OIL OIL: 1.0000 "application " | TOPICAL_OIL | Status: DC | PRN

## 2016-07-17 MED ORDER — ZOLPIDEM TARTRATE 5 MG PO TABS: 5.0000 mg | ORAL_TABLET | Freq: Every evening | ORAL | Status: DC | PRN

## 2016-07-17 MED ORDER — SIMETHICONE 80 MG PO CHEW
80.0000 mg | CHEWABLE_TABLET | ORAL | Status: DC
  Administered 2016-07-17 – 2016-07-19 (×3): 80 mg via ORAL
  Filled 2016-07-17 (×3): qty 1

## 2016-07-17 MED ORDER — FENTANYL CITRATE (PF) 100 MCG/2ML IJ SOLN
INTRAMUSCULAR | Status: AC
  Filled 2016-07-17: qty 2

## 2016-07-17 MED ORDER — PRENATE PIXIE 10-0.6-0.4-200 MG PO CAPS: 1.0000 | ORAL_CAPSULE | Freq: Every day | ORAL | Status: DC

## 2016-07-17 MED ORDER — METHYLERGONOVINE MALEATE 0.2 MG/ML IJ SOLN: 0.2000 mg | INTRAMUSCULAR | Status: DC | PRN

## 2016-07-17 MED ORDER — IBUPROFEN 600 MG PO TABS
600.0000 mg | ORAL_TABLET | Freq: Four times a day (QID) | ORAL | Status: DC
  Administered 2016-07-17 – 2016-07-20 (×10): 600 mg via ORAL
  Filled 2016-07-17 (×11): qty 1

## 2016-07-17 MED ORDER — MORPHINE SULFATE (PF) 0.5 MG/ML IJ SOLN
INTRAMUSCULAR | Status: AC
  Filled 2016-07-17: qty 10

## 2016-07-17 MED ORDER — SODIUM BICARBONATE 8.4 % IV SOLN
INTRAVENOUS | Status: DC | PRN
  Administered 2016-07-17 (×2): 4 mL via EPIDURAL

## 2016-07-17 MED ORDER — DIPHENHYDRAMINE HCL 25 MG PO CAPS: 25.0000 mg | ORAL_CAPSULE | ORAL | Status: DC | PRN

## 2016-07-17 MED ORDER — PIPERACILLIN-TAZOBACTAM 3.375 G IVPB
3.3750 g | Freq: Three times a day (TID) | INTRAVENOUS | Status: AC
  Administered 2016-07-17 – 2016-07-19 (×6): 3.375 g via INTRAVENOUS
  Filled 2016-07-17 (×6): qty 50

## 2016-07-17 MED ORDER — SODIUM CHLORIDE 0.9 % IV SOLN
1.0000 g | INTRAVENOUS | Status: DC
  Filled 2016-07-17 (×4): qty 1000

## 2016-07-17 MED ORDER — SIMETHICONE 80 MG PO CHEW
80.0000 mg | CHEWABLE_TABLET | Freq: Three times a day (TID) | ORAL | Status: DC
  Administered 2016-07-17 – 2016-07-20 (×6): 80 mg via ORAL
  Filled 2016-07-17 (×7): qty 1

## 2016-07-17 MED ORDER — MORPHINE SULFATE (PF) 0.5 MG/ML IJ SOLN
INTRAMUSCULAR | Status: DC | PRN
  Administered 2016-07-17: 1 mg via EPIDURAL

## 2016-07-17 MED ORDER — METOCLOPRAMIDE HCL 5 MG/ML IJ SOLN
INTRAMUSCULAR | Status: AC
  Filled 2016-07-17: qty 2

## 2016-07-17 MED ORDER — SENNOSIDES-DOCUSATE SODIUM 8.6-50 MG PO TABS
2.0000 | ORAL_TABLET | ORAL | Status: DC
  Administered 2016-07-19 (×2): 2 via ORAL
  Filled 2016-07-17 (×3): qty 2

## 2016-07-17 MED ORDER — KETOROLAC TROMETHAMINE 30 MG/ML IJ SOLN
INTRAMUSCULAR | Status: AC
  Filled 2016-07-17: qty 1

## 2016-07-17 MED ORDER — ACETAMINOPHEN 325 MG PO TABS
650.0000 mg | ORAL_TABLET | ORAL | Status: DC | PRN
  Administered 2016-07-17: 650 mg via ORAL
  Filled 2016-07-17: qty 2

## 2016-07-17 MED ORDER — NALBUPHINE HCL 10 MG/ML IJ SOLN: 5.0000 mg | INTRAMUSCULAR | Status: DC | PRN

## 2016-07-17 MED ORDER — NALOXONE HCL 2 MG/2ML IJ SOSY
1.0000 ug/kg/h | PREFILLED_SYRINGE | INTRAVENOUS | Status: DC | PRN
  Filled 2016-07-17: qty 2

## 2016-07-17 MED ORDER — DIPHENHYDRAMINE HCL 50 MG/ML IJ SOLN: 12.5000 mg | INTRAMUSCULAR | Status: DC | PRN

## 2016-07-17 MED ORDER — MEPERIDINE HCL 25 MG/ML IJ SOLN: 6.2500 mg | INTRAMUSCULAR | Status: DC | PRN

## 2016-07-17 MED ORDER — PHENYLEPHRINE 8 MG IN D5W 100 ML (0.08MG/ML) PREMIX OPTIME
INJECTION | INTRAVENOUS | Status: AC
  Filled 2016-07-17: qty 100

## 2016-07-17 MED ORDER — OXYTOCIN 40 UNITS IN LACTATED RINGERS INFUSION - SIMPLE MED: 2.5000 [IU]/h | INTRAVENOUS | Status: AC

## 2016-07-17 MED ORDER — OXYTOCIN 10 UNIT/ML IJ SOLN
INTRAVENOUS | Status: DC | PRN
  Administered 2016-07-17: 40 [IU] via INTRAVENOUS

## 2016-07-17 MED ORDER — OXYTOCIN 10 UNIT/ML IJ SOLN
INTRAMUSCULAR | Status: AC
  Filled 2016-07-17: qty 4

## 2016-07-17 MED ORDER — PRENATAL MULTIVITAMIN CH
1.0000 | ORAL_TABLET | Freq: Every day | ORAL | Status: DC
  Administered 2016-07-18 – 2016-07-19 (×2): 1 via ORAL
  Filled 2016-07-17 (×2): qty 1

## 2016-07-17 MED ORDER — SODIUM CHLORIDE 0.9 % IV SOLN
2.0000 g | Freq: Once | INTRAVENOUS | Status: AC
  Administered 2016-07-17: 2 g via INTRAVENOUS
  Filled 2016-07-17: qty 2000

## 2016-07-17 MED ORDER — LACTATED RINGERS IV SOLN: INTRAVENOUS | Status: DC

## 2016-07-17 MED ORDER — ONDANSETRON HCL 4 MG/2ML IJ SOLN: 4.0000 mg | Freq: Three times a day (TID) | INTRAMUSCULAR | Status: DC | PRN

## 2016-07-17 MED ORDER — WITCH HAZEL-GLYCERIN EX PADS: 1.0000 "application " | MEDICATED_PAD | CUTANEOUS | Status: DC | PRN

## 2016-07-17 MED ORDER — NALOXONE HCL 0.4 MG/ML IJ SOLN: 0.4000 mg | INTRAMUSCULAR | Status: DC | PRN

## 2016-07-17 MED ORDER — DIBUCAINE 1 % RE OINT: 1.0000 "application " | TOPICAL_OINTMENT | RECTAL | Status: DC | PRN

## 2016-07-17 MED ORDER — DIPHENHYDRAMINE HCL 25 MG PO CAPS: 25.0000 mg | ORAL_CAPSULE | Freq: Four times a day (QID) | ORAL | Status: DC | PRN

## 2016-07-17 MED ORDER — OXYTOCIN 40 UNITS IN LACTATED RINGERS INFUSION - SIMPLE MED
INTRAVENOUS | Status: AC
  Administered 2016-07-17: 2.5 [IU]/h via INTRAVENOUS
  Filled 2016-07-17: qty 1000

## 2016-07-17 MED ORDER — KETOROLAC TROMETHAMINE 30 MG/ML IJ SOLN
30.0000 mg | Freq: Four times a day (QID) | INTRAMUSCULAR | Status: DC | PRN
  Administered 2016-07-17: 30 mg via INTRAMUSCULAR

## 2016-07-17 MED ORDER — METOCLOPRAMIDE HCL 5 MG/ML IJ SOLN
INTRAMUSCULAR | Status: DC | PRN
  Administered 2016-07-17: 10 mg via INTRAVENOUS

## 2016-07-17 MED ORDER — SIMETHICONE 80 MG PO CHEW
80.0000 mg | CHEWABLE_TABLET | ORAL | Status: DC | PRN
  Administered 2016-07-18: 80 mg via ORAL

## 2016-07-17 MED ORDER — ACETAMINOPHEN 500 MG PO TABS
ORAL_TABLET | ORAL | Status: AC
  Administered 2016-07-17: 650 mg via ORAL
  Filled 2016-07-17: qty 1

## 2016-07-17 MED ORDER — ACETAMINOPHEN 500 MG PO TABS
1000.0000 mg | ORAL_TABLET | Freq: Once | ORAL | Status: AC
Start: 2016-07-17 — End: 2016-07-17
  Administered 2016-07-17: 1000 mg via ORAL
  Filled 2016-07-17: qty 2

## 2016-07-17 MED ORDER — SCOPOLAMINE 1 MG/3DAYS TD PT72
1.0000 | MEDICATED_PATCH | Freq: Once | TRANSDERMAL | Status: DC
  Filled 2016-07-17: qty 1

## 2016-07-17 SURGICAL SUPPLY — 38 items
CHLORAPREP W/TINT 26ML (MISCELLANEOUS) ×6 IMPLANT
CLAMP CORD UMBIL (MISCELLANEOUS) IMPLANT
CLOTH BEACON ORANGE TIMEOUT ST (SAFETY) ×3 IMPLANT
DERMABOND ADVANCED (GAUZE/BANDAGES/DRESSINGS) ×4
DERMABOND ADVANCED .7 DNX12 (GAUZE/BANDAGES/DRESSINGS) ×2 IMPLANT
DRSG OPSITE POSTOP 4X10 (GAUZE/BANDAGES/DRESSINGS) ×3 IMPLANT
ELECT REM PT RETURN 9FT ADLT (ELECTROSURGICAL) ×3
ELECTRODE REM PT RTRN 9FT ADLT (ELECTROSURGICAL) ×1 IMPLANT
EXTRACTOR VACUUM BELL STYLE (SUCTIONS) IMPLANT
GLOVE BIOGEL PI IND STRL 7.0 (GLOVE) ×1 IMPLANT
GLOVE BIOGEL PI IND STRL 8 (GLOVE) ×1 IMPLANT
GLOVE BIOGEL PI INDICATOR 7.0 (GLOVE) ×2
GLOVE BIOGEL PI INDICATOR 8 (GLOVE) ×2
GLOVE ECLIPSE 8.0 STRL XLNG CF (GLOVE) ×3 IMPLANT
GOWN STRL REUS W/TWL LRG LVL3 (GOWN DISPOSABLE) ×6 IMPLANT
KIT ABG SYR 3ML LUER SLIP (SYRINGE) ×3 IMPLANT
NEEDLE HYPO 18GX1.5 BLUNT FILL (NEEDLE) ×3 IMPLANT
NEEDLE HYPO 22GX1.5 SAFETY (NEEDLE) ×3 IMPLANT
NEEDLE HYPO 25X5/8 SAFETYGLIDE (NEEDLE) ×3 IMPLANT
NS IRRIG 1000ML POUR BTL (IV SOLUTION) ×3 IMPLANT
PACK C SECTION WH (CUSTOM PROCEDURE TRAY) ×3 IMPLANT
PAD OB MATERNITY 4.3X12.25 (PERSONAL CARE ITEMS) ×3 IMPLANT
PENCIL SMOKE EVAC W/HOLSTER (ELECTROSURGICAL) ×3 IMPLANT
RTRCTR C-SECT PINK 25CM LRG (MISCELLANEOUS) IMPLANT
RTRCTR C-SECT PINK 34CM XLRG (MISCELLANEOUS) ×3 IMPLANT
SPONGE LAP 18X18 X RAY DECT (DISPOSABLE) ×9 IMPLANT
SUT CHROMIC 0 CT 1 (SUTURE) ×3 IMPLANT
SUT MNCRL 0 VIOLET CTX 36 (SUTURE) ×2 IMPLANT
SUT MONOCRYL 0 CTX 36 (SUTURE) ×4
SUT PLAIN 2 0 (SUTURE)
SUT PLAIN 2 0 XLH (SUTURE) ×3 IMPLANT
SUT PLAIN ABS 2-0 CT1 27XMFL (SUTURE) IMPLANT
SUT VIC AB 0 CTX 36 (SUTURE) ×2
SUT VIC AB 0 CTX36XBRD ANBCTRL (SUTURE) ×1 IMPLANT
SUT VIC AB 4-0 KS 27 (SUTURE) IMPLANT
SYR 20CC LL (SYRINGE) ×6 IMPLANT
TOWEL OR 17X24 6PK STRL BLUE (TOWEL DISPOSABLE) ×3 IMPLANT
TRAY FOLEY CATH SILVER 14FR (SET/KITS/TRAYS/PACK) IMPLANT

## 2016-07-17 NOTE — Progress Notes (Signed)
Patient ID: Kara Booker, female   DOB: 1990-07-14, 26 y.o.   MRN: [id number] S; denies any discomfort O: FHR pattern reassuring, SVE 7/90/-3 IUPC placed with out difficulty A: IUP @ 39.1, Prev c/s, macrosomia, GDM P: continue present POC.

## 2016-07-17 NOTE — Progress Notes (Addendum)
ANTIBIOTIC CONSULT NOTE - INITIAL  Pharmacy Consult for Gentamicin Indication: Chorioamnionitis   No Known Allergies  Patient Measurements: Height: 5\' 1"  (154.9 cm) Weight: 234 lb (106.1 kg) IBW/kg (Calculated) : 47.8 kg Adjusted Body Weight: 65 kg Dosing weight: 65 kg  Vital Signs: Temp: 99.8 F (37.7 C) (01/14 1055) Temp Source: Oral (01/14 1055) BP: 100/72 (01/14 1132) Pulse Rate: 98 (01/14 1132)  Labs:  Recent Labs  07/16/16 0820  WBC 9.3  HGB 10.4*  PLT 307   No current SCr available to calculate the CrCl.  Estimated CrCl to be ~90 ml/min using an estimated SCr of 0.7.   Microbiology: Recent Results (from the past 720 hour(s))  OB RESULT CONSOLE Group B Strep     Status: None   Collection Time: 06/28/16 12:00 AM  Result Value Ref Range Status   GBS Negative  Final  Culture, beta strep (group b only)     Status: None   Collection Time: 06/28/16  4:01 PM  Result Value Ref Range Status   Strep Gp B Culture Negative Negative Final    Comment: Centers for Disease Control and Prevention (CDC) and American Congress of Obstetricians and Gynecologists (ACOG) guidelines for prevention of perinatal group B streptococcal (GBS) disease specify co-collection of a vaginal and rectal swab specimen to maximize sensitivity of GBS detection. Per the CDC and ACOG, swabbing both the lower vagina and rectum substantially increases the yield of detection compared with sampling the vagina alone. Penicillin G, ampicillin, or cefazolin are indicated for intrapartum prophylaxis of perinatal GBS colonization. Reflex susceptibility testing should be performed prior to use of clindamycin only on GBS isolates from penicillin-allergic women who are considered a high risk for anaphylaxis. Treatment with vancomycin without additional testing is warranted if resistance to clindamycin is noted.     Medications:  Ampicillin 2 grams IV then 1 gram IV Q 4 hr  Assessment: 26 y.o. female  [id number] at 2056w1d  Estimated Ke = 0.275, Vd =0.33 L/kg  Goal of Therapy:  Gentamicin peak 6-8 mg/L and Trough < 1 mg/L  Plan:  Gentamicin 140 mg IV every 8 hrs  Check Scr with next labs if gentamicin continued. Will check gentamicin levels if continued > 72hr or clinically indicated.  Natasha BenceCline, Flora Parks 07/17/2016,11:44 AM

## 2016-07-17 NOTE — Lactation Note (Signed)
This note was copied from a baby's chart. Lactation Consultation Note  Patient Name: Girl Ocie DoyneLlasselen Ribaudo ZOXWR'UToday's Date: 07/17/2016 Reason for consult: Initial assessment  Initial visit at 10 hours of life. Infant has already been to the breast 3 times. Mom is a P3 who nursed her 1st 2 children for 2-3 months & 9 months, respectively. Mom reports low supply with her 1st child, but not the 2nd.  Of interest, Mom states that her 1st child was found to have a tongue-tie at the age of 1 when he was receiving speech therapy. Mother also reports that 2 of her nieces or nephews had tongue-ties, as well. She would like someone to evaluate this infant for a tongue-tie during her stay.   Mom says that her RN taught her how to do hand expression and that she was able to express colostrum, which helped the infant to latch. Mom says she feels comfortable latching infant.   Mom made aware of O/P services, breastfeeding support groups, community resources, and our phone # for post-discharge questions.   Lurline HareRichey, [facility] 07/17/2016, 10:59 PM

## 2016-07-17 NOTE — Anesthesia Postprocedure Evaluation (Signed)
Anesthesia Post Note  Patient: Kara Booker  Procedure(s) Performed: Procedure(s) (LRB): CESAREAN SECTION (N/A)  Patient location during evaluation: PACU Anesthesia Type: Epidural Level of consciousness: awake and alert Pain management: pain level controlled Vital Signs Assessment: post-procedure vital signs reviewed and stable Respiratory status: spontaneous breathing, nonlabored ventilation and respiratory function stable Cardiovascular status: stable Postop Assessment: no headache, no backache and epidural receding Anesthetic complications: no        Last Vitals:  Vitals:   07/17/16 1445 07/17/16 1500  BP: 109/75 107/71  Pulse: (!) 111 (!) 114  Resp: 19 (!) 21  Temp:      Last Pain:  Vitals:   07/17/16 1445  TempSrc:   PainSc: 4    Pain Goal:                 Kennieth RadFitzgerald, Erma Raiche E

## 2016-07-17 NOTE — Transfer of Care (Signed)
Immediate Anesthesia Transfer of Care Note  Patient: Kara Booker  Procedure(s) Performed: Procedure(s): CESAREAN SECTION (N/A)  Patient Location: PACU  Anesthesia Type:Epidural  Level of Consciousness: sedated  Airway & Oxygen Therapy: Patient Spontanous Breathing  Post-op Assessment: Report given to RN  Post vital signs: Reviewed and stable  Last Vitals:  Vitals:   07/17/16 1203 07/17/16 1217  BP: (!) 100/48 108/68  Pulse: 95 97  Resp: 18 20  Temp: 37.4 C     Last Pain:  Vitals:   07/17/16 1203  TempSrc: Oral  PainSc: 0-No pain         Complications: No apparent anesthesia complications

## 2016-07-17 NOTE — Addendum Note (Signed)
Addendum  created 07/17/16 1558 by Renford DillsJanet L Kalaya Infantino, CRNA   Sign clinical note

## 2016-07-17 NOTE — Op Note (Signed)
Preoperative diagnosis:  1.  Intrauterine pregnancy at 4141w1d  weeks gestation                                         2.  Class A2 DM                                         3.  Fetal Macrosomia >4500 grams                                         4.  Persistent fetal tachycardia with subsequent loss of variability                                         5.  IAI    Postoperative diagnosis:  Same as above   Procedure:  Repeatcesarean section  Surgeon:  Lazaro ArmsLuther H Eure MD  Assistant:    Anesthesia: Epidural  Findings:  Fetus developed FHR 190s with subsequent loss of variability unresponsive to fluid bolus, tylenol and oxygen therapy. The concern is persistent FHR in this range will quickly lead to fetal metabolic acidosis and the loss of variability points to that development.  I decdided to go ahead and give antibiotics to cover for the probable IAI which is the source of this drastic FHR baseline change.   Over a low transverse incision was delivered a viable female with Apgars of 9 and 9 weighing pending lbs.  oz. Uterus, tubes and ovaries were all normal.  There were no other significant findings  Description of operation:  Patient was taken to the operating room and placed in the sitting position where she underwent a spinal anesthetic. She was then placed in the supine position with tilt to the left side. When adequate anesthetic level was obtained she was prepped and draped in usual sterile fashion and a Foley catheter was placed. A Pfannenstiel skin incision was made and carried down sharply to the rectus fascia which was scored in the midline extended laterally. The fascia was taken off the muscles both superiorly and without difficulty. The muscles were divided.  The peritoneal cavity was entered.  Bladder blade was placed, no bladder flap was created.  A low transverse hysterotomy incision was made and delivered a viable female  infant at 1254 with Apgars of 9 and 9 weighingpending lbs   oz.  Cord pH was obtained and was 7.337. The uterus was exteriorized. There was a transection of the left paramentrial vessel(descending branch of the uterine and the ascending branch of the vaginal artery) that was difficult to locate.  I placed a figure of 8 on either side which stopped the bleeding.  The fetal shoulder abruptly "popped out" when making the uterine incision and that may have been the source of the transection which was remote from the uterine incision.  It was closed in 2 layers, the first being a running interlocking layer and the second being an imbricating layer using 0 monocryl on a CTX needle. There was good resulting hemostasis. The uterus tubes and ovaries were all normal. Peritoneal cavity was irrigated vigorously. The  muscles and peritoneum were reapproximated loosely. The fascia was closed using 0 Vicryl in running fashion. Subcutaneous tissue was made hemostatic and irrigated.  The skin was closed using skin staples.  She had a significant edema and weepy sub cutaneous tissue. Blood loss for the procedure was 1000 cc. The patient was taken to the recovery room in good stable condition with all counts being correct x3.  EBL 1000 cc  EURE,LUTHER H 07/17/2016 1:52 PM

## 2016-07-17 NOTE — Progress Notes (Signed)
Interpreter at bs to help patient and family.

## 2016-07-17 NOTE — Progress Notes (Signed)
Patient seen doing well. No complaints. Slept through the night. Cervix 6/70/-3 AROM for light meconium. Continue expectant management. May consider placing IUPC at next check.

## 2016-07-17 NOTE — Anesthesia Postprocedure Evaluation (Addendum)
Anesthesia Post Note  Patient: Kara Booker  Procedure(s) Performed: Procedure(s) (LRB): CESAREAN SECTION (N/A)  Patient location during evaluation: Mother Baby Anesthesia Type: Epidural Level of consciousness: awake Pain management: pain level controlled Vital Signs Assessment: post-procedure vital signs reviewed and stable Respiratory status: spontaneous breathing Cardiovascular status: stable Postop Assessment: no headache, no backache, epidural receding, patient able to bend at knees, no signs of nausea or vomiting and adequate PO intake Anesthetic complications: no        Last Vitals:  Vitals:   07/17/16 1500 07/17/16 1525  BP: 107/71 100/60  Pulse: (!) 114   Resp: (!) 21 18  Temp:  37.5 C    Last Pain:  Vitals:   07/17/16 1525  TempSrc: Oral  PainSc: 0-No pain   Pain Goal:                 MULLINS,JANET

## 2016-07-17 NOTE — Progress Notes (Signed)
Kara Booker is a 26 y.o. [id number] at 6844w1d  admitted for induction of labor due to [redacted] weeks gestation class A2 DM.  Subjective:   Objective: BP 108/68   Pulse 97   Temp 99.4 F (37.4 C) (Oral)   Resp 20   Ht 5\' 1"  (1.549 m)   Wt 234 lb (106.1 kg)   LMP 10/05/2015 (Approximate)   SpO2 99%   BMI 44.21 kg/m  No intake/output data recorded. Total I/O In: 1500 [I.V.:1500] Out: 2000 [Urine:600; Emesis/NG output:400; Blood:1000]  FHT:  Fetal tachycardia persistent with loss of variability UC:   regular, every 3 minutes SVE:   Dilation: 8.5 Effacement (%): 100 Station: -2 Exam by:: [email or web address],rnc-ob  Labs: Lab Results  Component Value Date   WBC 9.3 07/16/2016   HGB 10.4 (L) 07/16/2016   HCT 31.0 (L) 07/16/2016   MCV 73.8 (L) 07/16/2016   PLT 307 07/16/2016    Assessment / Plan: Fetal tachycardia persistent with loss of fetal variability concerning for development of metabolic acidosis remote from delivery  Discussed with patient who agrees with plan to proceed with repeat Caesarean section    EURE,LUTHER H 07/17/2016, 1148AM

## 2016-07-18 LAB — CBC
HCT: 21.8 % — ABNORMAL LOW (ref 36.0–46.0)
HEMOGLOBIN: 7.5 g/dL — AB (ref 12.0–15.0)
MCH: 25.5 pg — AB (ref 26.0–34.0)
MCHC: 34.4 g/dL (ref 30.0–36.0)
MCV: 74.1 fL — AB (ref 78.0–100.0)
PLATELETS: 242 10*3/uL (ref 150–400)
RBC: 2.94 MIL/uL — AB (ref 3.87–5.11)
RDW: 15.3 % (ref 11.5–15.5)
WBC: 12.1 10*3/uL — AB (ref 4.0–10.5)

## 2016-07-18 MED ORDER — OXYCODONE-ACETAMINOPHEN 5-325 MG PO TABS
1.0000 | ORAL_TABLET | ORAL | Status: DC | PRN
  Administered 2016-07-18 – 2016-07-19 (×3): 1 via ORAL
  Filled 2016-07-18 (×5): qty 1

## 2016-07-18 MED ORDER — OXYCODONE-ACETAMINOPHEN 5-325 MG PO TABS
2.0000 | ORAL_TABLET | ORAL | Status: DC | PRN
  Administered 2016-07-19 – 2016-07-20 (×3): 2 via ORAL
  Filled 2016-07-18 (×2): qty 2

## 2016-07-18 NOTE — Progress Notes (Signed)
Subjective: Postpartum Day 1: Cesarean Delivery Patient reports incisional pain, tolerating PO, + flatus and no problems voiding.    Objective: Vital signs in last 24 hours: Temp:  [97.6 F (36.4 C)-101.1 F (38.4 C)] 98.4 F (36.9 C) (01/15 0245) Pulse Rate:  [67-134] 96 (01/15 0245) Resp:  [16-26] 18 (01/15 0245) BP: (88-163)/(48-129) 122/61 (01/15 0245) SpO2:  [92 %-99 %] 98 % (01/15 0245)  Physical Exam:  General: alert, cooperative, appears stated age and no distress Lochia: appropriate Uterine Fundus: firm Incision: no significant drainage, no dehiscence, no significant erythema DVT Evaluation: No evidence of DVT seen on physical exam.   Recent Labs  07/16/16 0820  HGB 10.4*  HCT 31.0*    Assessment/Plan: Status post Cesarean section. Doing well postoperatively.  Continue current care.  Wyvonnia DuskyMarie Lawson 07/18/2016, 5:11 AM

## 2016-07-18 NOTE — Lactation Note (Signed)
This note was copied from a baby's chart. Lactation Consultation Note  Patient Name: [name] WUJWJ'XToday's Date: 07/18/2016 Reason for consult: Follow-up assessment   Follow up with Exp BF mom of 25 hour old infant. Infant was being held by visitor and asleep at this time. Mom reports she is feeding well and is cluster feeding, discussed normalcy of cluster feeding. Mom reports she is feeling a little fuller today. She is hand expressing prior to latch and reports seeing colostrum every time. She reports she is feeling tender with initial latch that improves with feeding. Enc her to apply EBM post BF. Mom voiced understanding. Mom declined questions, concerns, or need for LC assistance at this time. Enc her to call front desk if feeding assistance needed.    Maternal Data Formula Feeding for Exclusion: No Has patient been taught Hand Expression?: Yes Does the patient have breastfeeding experience prior to this delivery?: Yes  Feeding Length of feed: 20 min  LATCH Score/Interventions                      Lactation Tools Discussed/Used     Consult Status Consult Status: Follow-up Date: 07/19/16 Follow-up type: In-patient    Silas FloodSharon S Promiss Labarbera 07/18/2016, 2:51 PM

## 2016-07-18 NOTE — Progress Notes (Signed)
Asked MOB to call out for this RN to observe a latch. Left name and number on pts board.

## 2016-07-18 NOTE — Progress Notes (Signed)
UR chart review completed.  

## 2016-07-19 LAB — CBC
HEMATOCRIT: 19.9 % — AB (ref 36.0–46.0)
HEMOGLOBIN: 6.6 g/dL — AB (ref 12.0–15.0)
MCH: 24.9 pg — ABNORMAL LOW (ref 26.0–34.0)
MCHC: 33.2 g/dL (ref 30.0–36.0)
MCV: 75.1 fL — ABNORMAL LOW (ref 78.0–100.0)
Platelets: 234 10*3/uL (ref 150–400)
RBC: 2.65 MIL/uL — ABNORMAL LOW (ref 3.87–5.11)
RDW: 15.7 % — AB (ref 11.5–15.5)
WBC: 9.8 10*3/uL (ref 4.0–10.5)

## 2016-07-19 LAB — PREPARE RBC (CROSSMATCH)

## 2016-07-19 MED ORDER — SODIUM CHLORIDE 0.9 % IV SOLN: Freq: Once | INTRAVENOUS | Status: DC

## 2016-07-19 NOTE — Progress Notes (Signed)
Mother states she is just slightly dizzy but able to get up and move around. She stated she may have a blood transfusion, still waiting for orders. Patient passing gas and told plan of care.

## 2016-07-19 NOTE — Progress Notes (Signed)
Patient ID: Kara Booker, female   DOB: Aug 08, 1990, 26 y.o.   MRN: [id number]  POSTPARTUM PROGRESS NOTE  Post Op/Partum Day #2  Subjective:  Kara Booker is a 26 y.o. [id number] 7213w1d s/p RLTCS after failed TOLAC.  No acute events overnight.  Pt denies problems with ambulating, voiding or po intake.  She denies nausea or vomiting.  Pain is well controlled.  She has had flatus. She has not had bowel movement.  Lochia Minimal. Discussed with patient regarding blood transfusion, due to patient has been slightly dizzy with ambulation and with blood count of 6.6 (from 10.4). Patient was consented and agreed to have 2 units PRBC.   Objective: Blood pressure 121/80, pulse 69, temperature 97.7 F (36.5 C), temperature source Oral, resp. rate 18, height 5\' 1"  (1.549 m), weight 234 lb (106.1 kg), last menstrual period 10/05/2015, SpO2 98 %, unknown if currently breastfeeding.  Physical Exam:  General: alert, cooperative and no distress Lochia:normal flow Chest: CTAB Heart: RRR no m/r/g Abdomen: +BS, soft, nontender; Pressure dressing removed, minimal saturation of honeycomb dressing.  Uterine Fundus: firm, below umbilicus DVT Evaluation: No calf swelling or tenderness Extremities: + edema bilaterally without pitting   Recent Labs  07/18/16 0601 07/19/16 0514  HGB 7.5* 6.6*  HCT 21.8* 19.9*    Assessment/Plan:  ASSESSMENT: Kara Booker is a 26 y.o. [id number] 6813w1d s/p RLTCS after failed TOLAC. Symptomatic anemia  Plan for discharge tomorrow, Breastfeeding and Contraception Depo  Plans for 2 units PRBC now, consent in chart and performed, will recheck tomorrow AM for CBC.   LOS: 3 days   Jen MowElizabeth Lacresha Fusilier, DO OB Fellow Center [facility]  07/19/2016, 5:11 PM

## 2016-07-19 NOTE — Progress Notes (Signed)
Dr. Omer JackMumaw aware of no note being written on patient. And they stated they needed to consent patient for a blood transfusion.

## 2016-07-19 NOTE — Progress Notes (Signed)
Mother told not to shower until later this evening .

## 2016-07-19 NOTE — Lactation Note (Signed)
This note was copied from a baby's chart. Lactation Consultation Note  Patient Name: Kara Booker XBJYN'WToday's Date: 07/19/2016 Reason for consult: Follow-up assessment Baby at 57 hr hr of life. Mom is worried because she has bilateral nipple bruising and blisters. She is worried that baby has a "tongue tie" because her sister's children did. Baby has a recessed chin, high palate, and noticeable labial/lingual frenulum. Baby can extend tongue over gum ridge, lift tongue to roof, has some lateralization, and nice peristolic movement once she stops biting down and gets into a rhythm. Baby did a lot of biting down at first when lactation put a gloved finger in her mouth. Gave comfort gels, reviewed nipple care, and positioning. Set up DEBP for use as soon as mom feels like she can. Encouraged her to call for latch help at the next feeding. Mom has offered formula but desires to ebf.    Maternal Data    Feeding    LATCH Score/Interventions Latch: Too sleepy or reluctant, no latch achieved, no sucking elicited.                    Lactation Tools Discussed/Used Pump Review: Setup, frequency, and cleaning;Milk Storage Initiated by:: ES Date initiated:: 07/19/16   Consult Status Consult Status: Follow-up Date: 07/20/16 Follow-up type: In-patient    [name] 07/19/2016, 10:10 PM

## 2016-07-19 NOTE — Progress Notes (Addendum)
At 1740 Patient's IV infiltrated. Had to call 2 different RNs to try to start IV. After 3rd try we were able to start the IV. Blood had expired at 1810 issue time was 1740. Blood bank was called to see if blood needed to be discarded. Carol from blood bank stated that even though the blood had expired at 1810 and it was not punctured, as long as it went in within in four hours of the issue time (1740) we could use the 1st unit of blood that had been picked up already by RN. Unit # [id number]. Will pass this information off to oncoming RN.

## 2016-07-19 NOTE — Progress Notes (Signed)
Patient states she had dizzyness this am but denies syncope now.

## 2016-07-20 LAB — CBC
HCT: 25.9 % — ABNORMAL LOW (ref 36.0–46.0)
Hemoglobin: 8.5 g/dL — ABNORMAL LOW (ref 12.0–15.0)
MCH: 25 pg — AB (ref 26.0–34.0)
MCHC: 32 g/dL (ref 30.0–36.0)
MCV: 78 fL (ref 78.0–100.0)
PLATELETS: 274 10*3/uL (ref 150–400)
RBC: 3.32 MIL/uL — AB (ref 3.87–5.11)
RDW: 16.2 % — AB (ref 11.5–15.5)
WBC: 10.5 10*3/uL (ref 4.0–10.5)

## 2016-07-20 LAB — TYPE AND SCREEN
ABO/RH(D): O POS
Antibody Screen: NEGATIVE
UNIT DIVISION: 0
UNIT DIVISION: 0

## 2016-07-20 MED ORDER — FERROUS SULFATE 325 (65 FE) MG PO TABS: 325.0000 mg | ORAL_TABLET | Freq: Two times a day (BID) | ORAL | 3 refills | Status: DC

## 2016-07-20 MED ORDER — DOCUSATE SODIUM 100 MG PO CAPS: 100.0000 mg | ORAL_CAPSULE | Freq: Two times a day (BID) | ORAL | 0 refills | Status: DC

## 2016-07-20 MED ORDER — OXYCODONE-ACETAMINOPHEN 5-325 MG PO TABS: 1.0000 | ORAL_TABLET | ORAL | 0 refills | Status: DC | PRN

## 2016-07-20 NOTE — Discharge Summary (Addendum)
OB Discharge Summary     Patient Name: Kara Booker DOB: 10-Aug-1990 MRN: [id number]  Date of admission: 07/16/2016 Delivering MD: Lazaro ArmsEURE, LUTHER H   Date of discharge: 07/20/2016  Admitting diagnosis: induction Intrauterine pregnancy: 4681w1d     Secondary diagnosis:  Active Problems:   GDM, class A2   Acute blood loss anemia  Additional problems: Transfusion 2U PRBC due to symptomatic anemia with Hgb 6.6 from 10.4     Discharge diagnosis: Term Pregnancy Delivered                                                                                                Post partum procedures:blood transfusion  Augmentation: AROM, Pitocin and Foley Balloon  Complications: Intrauterine Inflammation or infection (Chorioamniotis)  Hospital course:  Onset of Labor With Unplanned C/S  26 y.o. yo [id number] at 3381w1d was admitted in Latent Labor on 07/16/2016. Patient had a labor course significant for fetal tachycardia persistent with loss of fetal variability. Membrane Rupture Time/Date: 6:27 AM ,07/17/2016   The patient went for cesarean section due to Non-Reassuring FHR, and delivered a Viable infant,07/17/2016  Details of operation can be found in separate operative note. Patient had an uncomplicated postpartum course.  She is ambulating,tolerating a regular diet, passing flatus, and urinating well.  Patient is discharged home in stable condition 07/20/16.   Physical exam  Vitals:   07/19/16 2127 07/19/16 2142 07/20/16 0010 07/20/16 0520  BP: 132/75 118/71 118/75 111/72  Pulse: 92 99 80 69  Resp: 18 18 18 18   Temp: 98.6 F (37 C) 98 F (36.7 C) 98.3 F (36.8 C) 98.2 F (36.8 C)  TempSrc: Oral Oral Oral Oral  SpO2: 99% 99% 99% 99%  Weight:      Height:       General: alert and cooperative Lochia: appropriate Uterine Fundus: firm Incision: Healing well with no significant drainage, No significant erythema DVT Evaluation: No evidence of DVT seen on physical exam. Labs: Lab Results   Component Value Date   WBC 10.5 07/20/2016   HGB 8.5 (L) 07/20/2016   HCT 25.9 (L) 07/20/2016   MCV 78.0 07/20/2016   PLT 274 07/20/2016   CMP Latest Ref Rng & Units 12/30/2015  Glucose 65 - 99 mg/dL 578(I109(H)  BUN 6 - 20 mg/dL 10  Creatinine 6.960.44 - 2.951.00 mg/dL 2.840.49  Sodium 132135 - 440145 mmol/L 133(L)  Potassium 3.5 - 5.1 mmol/L 3.7  Chloride 101 - 111 mmol/L 101  CO2 22 - 32 mmol/L 23  Calcium 8.9 - 10.3 mg/dL 9.1  Total Protein 6.5 - 8.1 g/dL 8.0  Total Bilirubin 0.3 - 1.2 mg/dL 0.3  Alkaline Phos 38 - 126 U/L 59  AST 15 - 41 U/L 18  ALT 14 - 54 U/L 15    Discharge instruction: per After Visit Summary and "Baby and Me Booklet".  After visit meds:  Allergies as of 07/20/2016   No Known Allergies     Medication List    STOP taking these medications   PRENATE PIXIE 10-0.6-0.4-200 MG Caps     TAKE these medications   aspirin  EC 81 MG tablet Take 81 mg by mouth daily.   docusate sodium 100 MG capsule Commonly known as:  COLACE Take 1 capsule (100 mg total) by mouth 2 (two) times daily.   ferrous sulfate 325 (65 FE) MG tablet Take 1 tablet (325 mg total) by mouth 2 (two) times daily with a meal.   glyBURIDE 2.5 MG tablet Commonly known as:  DIABETA Take 2.5 mg by mouth daily with breakfast.   oxyCODONE-acetaminophen 5-325 MG tablet Commonly known as:  ROXICET Take 1-2 tablets by mouth every 4 (four) hours as needed for severe pain.       Diet: routine diet  Activity: Advance as tolerated. Pelvic rest for 6 weeks.   Outpatient follow up:6 weeks Follow up Appt: Future Appointments Date Time Provider Department Center  08/08/2016 8:30 AM Catalina Antigua, MD [facility] None   Follow up Visit:No Follow-up on file.  Postpartum contraception: Depo Provera  Newborn Data: Live born female  Birth Weight: 9 lb 2.6 oz (4155 g) APGAR: 9, 9  Baby Feeding: Breast Disposition:home with mother   07/20/2016 Renne Musca, MD  OB FELLOW DISCHARGE ATTESTATION  I have  seen and examined this patient and agree with above documentation in the resident's note.   Ernestina Penna, MD 10:53 AM

## 2016-07-20 NOTE — Discharge Instructions (Signed)

## 2016-07-20 NOTE — Lactation Note (Signed)
This note was copied from a baby's chart. Lactation Consultation Note  Patient Name: Kara Booker ZOXWR'UToday's Date: 07/20/2016 Reason for consult: Follow-up assessment;Infant weight loss;Breast/nipple pain (6% weight loss )  Baby is 1971 hours old and has mostly been feeding at the breast with exception of receiving formula 1-2 times  Due to mom being transfused last evening. Mom explained it was to difficult to breast feed due to the IV's.  LC updated doc flow per mom and added several feedings .  Baby awake and hungry @ consult, LC offered to assist with latch. LC reviewed importance of achieving  Comfort and depth when the baby is latched due to the positional strips both nipples. Mom easily hand expressed.  LC reminded mom the baby has a high palate , so when latching important tickle the baby's upper lip and wait for the  Wide open mouth, and then bring the baby in quick with breast compressions until the baby is in a swallowing pattern, And then intermittent breast compressions. Mom seemed amazed to hear the baby  Swallowing so much.  The baby fed for 25 mins, and when the baby released the nipple was well rounded and the tissue appeared healthier.  Per mom the feeding was very comfortable. Baby only fed on the 1st breast.  Per mom breast are fuller and warmer today. LC reassured mom that is a good sign that her mature milk is coming in. Sore nipple and engorgement prevention and tx reviewed. Mom already has comfort gels and has been using them.  LC reminded mom to use her EBM to both nipples liberally. LC instructed mom on the use shells when not using the comfort gels.  Per mom is not active with WIC, and has a hand pump , but not a DEBP for D/C. Mom aware she can rent a DEBP ( already had been pumping  With the DEBP since yesterday ). After the baby latched so well and the baby had multiple swallows, and fed for 25 mins, mom was more reassured.  Mom aware of the resources after D/C. And  LC encouraged mom to call WIC and to set up appt.  Mother informed of post-discharge support and given phone number to the lactation department, including services for phone call assistance; out-patient appointments; and breastfeeding support group. List of other breastfeeding resources in the community given in the handout. Encouraged mother to call for problems or concerns related to breastfeeding.   Maternal Data Has patient been taught Hand Expression?: Yes  Feeding Feeding Type: Breast Fed (comfort with latch achieved ) Nipple Type: Slow - flow Length of feed: 25 min (multiple swallows,  nipple well rounded when baby released)  LATCH Score/Interventions Latch: Grasps breast easily, tongue down, lips flanged, rhythmical sucking. Intervention(s): Skin to skin;Teach feeding cues;Waking techniques Intervention(s): Adjust position;Assist with latch;Breast massage;Breast compression  Audible Swallowing: Spontaneous and intermittent  Type of Nipple: Everted at rest and after stimulation  Comfort (Breast/Nipple): Filling, red/small blisters or bruises, mild/mod discomfort     Hold (Positioning): Assistance needed to correctly position infant at breast and maintain latch. Intervention(s): Breastfeeding basics reviewed;Support Pillows;Position options;Skin to skin  LATCH Score: 8  Lactation Tools Discussed/Used Tools: Pump;Shells;Comfort gels Shell Type: Inverted Breast pump type: Manual (and DEBP )   Consult Status Consult Status: Follow-up Date: 07/20/16 Follow-up type: In-patient    Kara SprangMargaret Ann Shanna Booker 07/20/2016, 12:14 PM

## 2016-07-21 DIAGNOSIS — D62 Acute posthemorrhagic anemia: Secondary | ICD-10-CM

## 2016-07-25 ENCOUNTER — Telehealth: Payer: Self-pay | Admitting: *Deleted

## 2016-07-25 NOTE — Telephone Encounter (Signed)
Pt called to office for pain medication. Pt states that she had a delivery last week, c/s, and is needing something for pain. Pt was given Roxicet on d/c and has taken what was given.  Pt would like to know if she can get another Rx for pain.    Please advise.

## 2016-07-26 NOTE — Telephone Encounter (Signed)
Ibuprofen should be adequate at this point.

## 2016-08-02 NOTE — Telephone Encounter (Signed)
Pt made aware pain med not approved. Pt states she still has staples in and thinks that's why she is hurting. Pt advised staples need to be taken out. Pt has been added to schedule for 08/03/16.

## 2016-08-03 ENCOUNTER — Encounter: Payer: Self-pay | Admitting: Obstetrics

## 2016-08-03 ENCOUNTER — Ambulatory Visit (INDEPENDENT_AMBULATORY_CARE_PROVIDER_SITE_OTHER): Payer: Self-pay | Admitting: Obstetrics

## 2016-08-03 DIAGNOSIS — Z3009 Encounter for other general counseling and advice on contraception: Secondary | ICD-10-CM

## 2016-08-03 DIAGNOSIS — Z4802 Encounter for removal of sutures: Secondary | ICD-10-CM

## 2016-08-03 DIAGNOSIS — Z30011 Encounter for initial prescription of contraceptive pills: Secondary | ICD-10-CM

## 2016-08-03 MED ORDER — NORETHINDRONE 0.35 MG PO TABS: 1.0000 | ORAL_TABLET | Freq: Every day | ORAL | 11 refills | Status: DC

## 2016-08-03 NOTE — Progress Notes (Signed)
PP pt presents for staple removal. C/S 07/17/16 by Dr. Despina HiddenEure. Pain reports pain at staples site.

## 2016-08-03 NOTE — Progress Notes (Signed)
Subjective:     Kara Booker is a 26 y.o. female who presents for a postpartum visit. She is 2 weeks postpartum following a low cervical transverse Cesarean section. I have fully reviewed the prenatal and intrapartum course. The delivery was at 39 gestational weeks. Outcome: repeat cesarean section, low transverse incision. Anesthesia: epidural. Postpartum course has been normal. Baby's course has been normal. Baby is feeding by breast. Bleeding thin lochia. Bowel function is normal. Bladder function is normal. Patient is not sexually active. Contraception method is oral progesterone-only contraceptive. Postpartum depression screening: negative.  Tobacco, alcohol and substance abuse history reviewed.  Adult immunizations reviewed including TDAP, rubella and varicella.  The following portions of the patient's history were reviewed and updated as appropriate: allergies, current medications, past family history, past medical history, past social history, past surgical history and problem list.  Review of Systems A comprehensive review of systems was negative.   Objective:    BP (!) 138/92   Pulse 82   Ht 5\' 1"  (1.549 m)   Wt 202 lb (91.6 kg)   BMI 38.17 kg/m   General:  alert and no distress   Breasts:  inspection negative, no nipple discharge or bleeding, no masses or nodularity palpable  Lungs: clear to auscultation bilaterally  Heart:  regular rate and rhythm, S1, S2 normal, no murmur, click, rub or gallop  Abdomen: soft, non-tender; bowel sounds normal; no masses,  no organomegaly and incision C, D, I.  Staples removed and steri strips applied to incision.   Vulva:  not evaluated  Vagina: not evaluated  Cervix:  not evaluated  Corpus: not examined  Adnexa:  not evaluated  Rectal Exam: Not performed.          50% of 20 min visit spent on counseling and coordination of care.    Assessment:     Normal postpartum exam. Pap smear not done at today's visit.     Contraceptive  Counseling and Advice.  Breastfeeding.   Wants Micronor.  Plan:    1. Contraception: oral progesterone-only contraceptive 2. Micronor Rx 3. Follow up in: 4 weeks or as needed.  2hr GTT for h/o GDM/screening for DM q 3 yrs per ADA recommendations Preconception counseling provided Healthy lifestyle practices reviewed

## 2016-08-08 ENCOUNTER — Ambulatory Visit: Payer: Medicaid Other | Admitting: Obstetrics and Gynecology

## 2016-08-15 ENCOUNTER — Ambulatory Visit: Payer: Self-pay | Admitting: Obstetrics

## 2016-08-31 ENCOUNTER — Ambulatory Visit: Payer: Medicaid Other | Admitting: Certified Nurse Midwife

## 2017-01-16 NOTE — Addendum Note (Signed)
Addendum  created 01/16/17 1724 by Marcene DuosFitzgerald, Tailer Volkert, MD   Sign clinical note

## 2017-02-20 ENCOUNTER — Encounter (HOSPITAL_COMMUNITY): Payer: Self-pay | Admitting: *Deleted

## 2017-02-20 ENCOUNTER — Ambulatory Visit (HOSPITAL_COMMUNITY)
Admission: EM | Admit: 2017-02-20 | Discharge: 2017-02-20 | Disposition: A | Discharge status: Home / Self Care | Payer: Medicaid Other | Attending: Emergency Medicine | Admitting: Emergency Medicine

## 2017-02-20 DIAGNOSIS — G43001 Migraine without aura, not intractable, with status migrainosus: Secondary | ICD-10-CM | POA: Diagnosis not present

## 2017-02-20 MED ORDER — KETOROLAC TROMETHAMINE 30 MG/ML IJ SOLN
30.0000 mg | Freq: Once | INTRAMUSCULAR | Status: AC
  Administered 2017-02-20: 30 mg via INTRAMUSCULAR

## 2017-02-20 MED ORDER — TOPIRAMATE 50 MG PO TABS: 50.0000 mg | ORAL_TABLET | Freq: Every day | ORAL | 0 refills | Status: DC

## 2017-02-20 MED ORDER — DEXAMETHASONE SODIUM PHOSPHATE 10 MG/ML IJ SOLN
INTRAMUSCULAR | Status: AC
  Filled 2017-02-20: qty 1

## 2017-02-20 MED ORDER — KETOROLAC TROMETHAMINE 30 MG/ML IJ SOLN
INTRAMUSCULAR | Status: AC
  Filled 2017-02-20: qty 1

## 2017-02-20 MED ORDER — DEXAMETHASONE SODIUM PHOSPHATE 10 MG/ML IJ SOLN
10.0000 mg | Freq: Once | INTRAMUSCULAR | Status: AC
  Administered 2017-02-20: 10 mg via INTRAMUSCULAR

## 2017-02-20 NOTE — ED Triage Notes (Signed)
Patient reports headache x several days. States she has taken migraine medication and allergy medications but is still having headache on the left side of her head. Patient reports photosensitivity.

## 2017-02-20 NOTE — Discharge Instructions (Signed)
I have attached the contact information for community health and wellness, contact them to schedule an appointment to establish for primary care.   Migraine headache, you've been given injection of Toradol here in clinic, along with a medicine called dexamethasone. When you get home tonight, take 2 tablets of Benadryl at 50 mg every 6 hours for the next day. Also prescribed a medicine called Topamax, take one tablet daily at bedtime. If your headache worsens go to the emergency room

## 2017-02-20 NOTE — ED Provider Notes (Signed)
  [facility]   [id number] 02/20/17 Arrival Time: 1846   SUBJECTIVE:  Kara Booker is a 26 y.o. female who presents to the urgent care  with complaint of headache centered in left side of her head, and on for 1 week. Has had a past history of migraines, she is light sensitive, and sound sensitive, headache is not worsened by certain foods, or by bearing down. Describes as a dull ache unrelieved by over-the-counter Tylenol, Excedrin, ibuprofen. She's had no nausea or vomiting, no blurred vision.  ROS: As per HPI, remainder of ROS negative.   OBJECTIVE:  Vitals:   02/20/17 1946  BP: 120/69  Pulse: 79  Resp: 16  Temp: 97.8 F (36.6 C)  TempSrc: Oral  SpO2: 100%     General appearance: alert; no distress HEENT: normocephalic; atraumatic; conjunctivae normal; No papilledema, tympanic membranes pearly grey without bulging or erythema Neck: Trachea midline no JVD Lungs: clear to auscultation bilaterally Heart: regular rate and rhythm Abdomen: soft, non-tender; bowel sounds normal; no masses or organomegaly; no guarding or rebound tenderness Musculoskeletal/extremities: Pulses +2 symmetrical fluid gait Skin: warm and dry Neurologic: Grossly normal  Cranial Nerves:  II: peripheral fields grossly intact III,IV, VI: ptosis not present, extra-ocular movements intact bilaterally, direct and consensual pupillary light reflexes intact bilaterally V: facial sensation, jaw opening, and bite strength equal bilaterally VII: eyebrow raise, eyelid close, smile, frown, pucker equal bilaterally VIII: hearing grossly normal bilaterally  IX,X: palate elevation and swallowing intact XI: bilateral shoulder shrug and lateral head rotation equal and strong XII: midline tongue extension  Psychological:  alert and cooperative; normal mood and affect     ASSESSMENT & PLAN:  1. Migraine without aura and with status migrainosus, not intractable     Meds ordered this encounter    Medications  . ketorolac (TORADOL) 30 MG/ML injection 30 mg  . dexamethasone (DECADRON) injection 10 mg  . topiramate (TOPAMAX) 50 MG tablet    Sig: Take 1 tablet (50 mg total) by mouth at bedtime.    Dispense:  5 tablet    Refill:  0    Order Specific Question:   Supervising Provider    Answer:   Elvina Sidle [[id number]]    Reviewed expectations re: course of current medical issues. Questions answered. Outlined signs and symptoms indicating need for more acute intervention. Patient verbalized understanding. After Visit Summary given.    Procedures:     Labs Reviewed - No data to display  No results found.  No Known Allergies  PMHx, SurgHx, SocialHx, Medications, and Allergies were reviewed in the Visit Navigator and updated as appropriate.       Dorena Bodo, NP 02/20/17 2153

## 2018-05-13 IMAGING — US US MFM FETAL BPP W/O NON-STRESS
1 series · 13 of 26 positions shown · non-contrast
Comparison: none

[Series 1: us mfm fetal bpp w/o non-stress · 26 acquisitions, 13 frames shown]
[im 2/26]
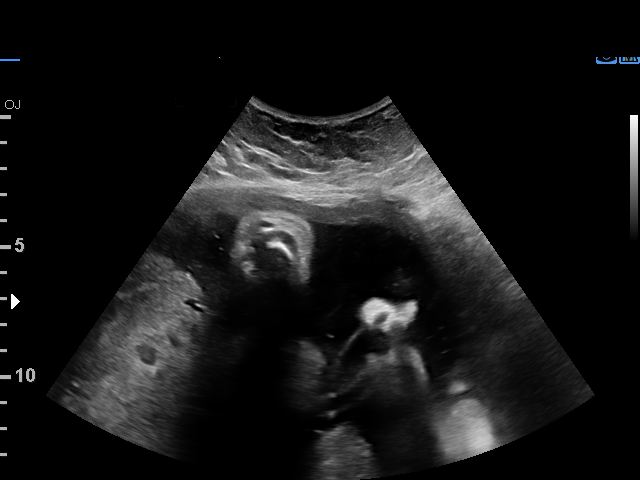
[im 4/26]
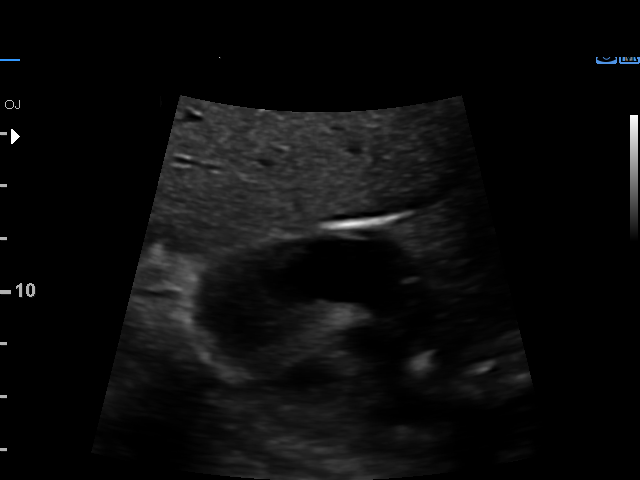
[im 6/26]
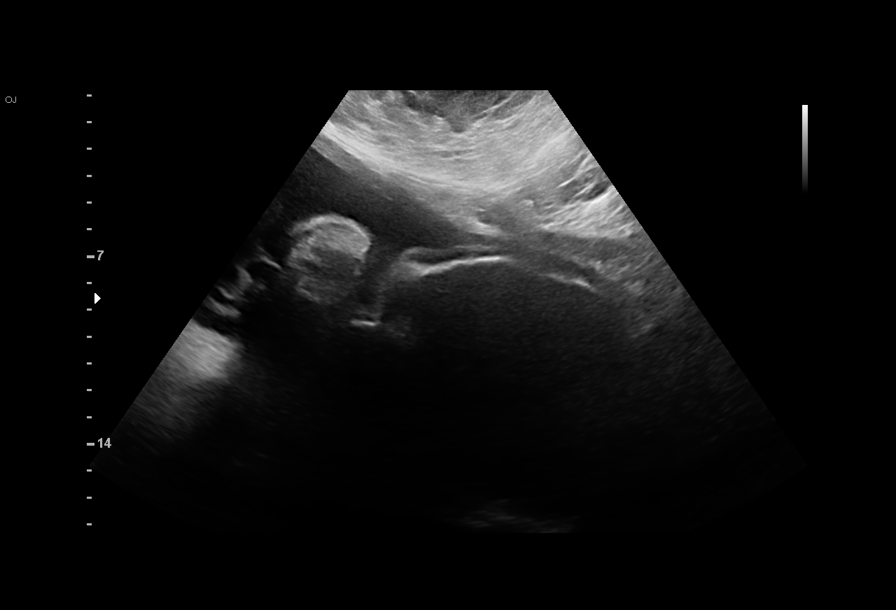
[im 8/26]
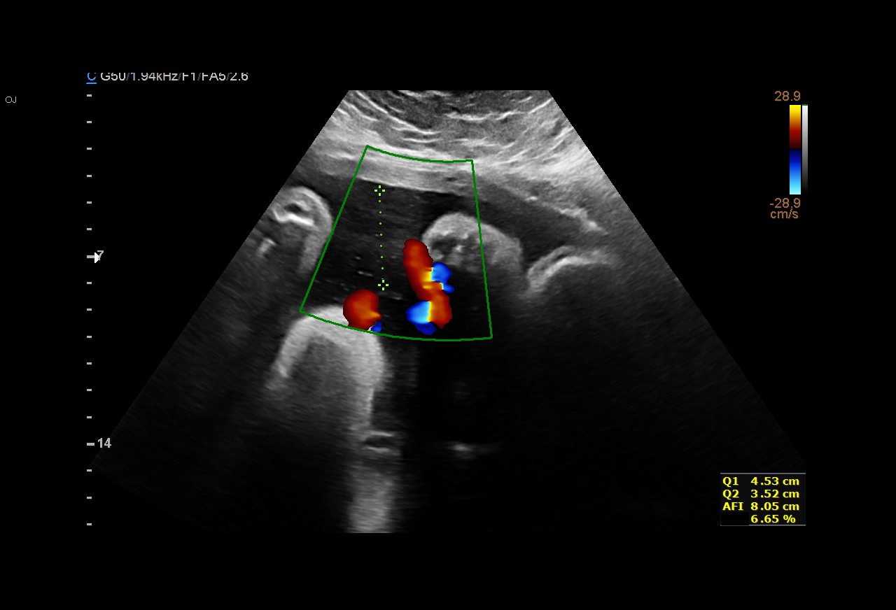
[im 10/26]
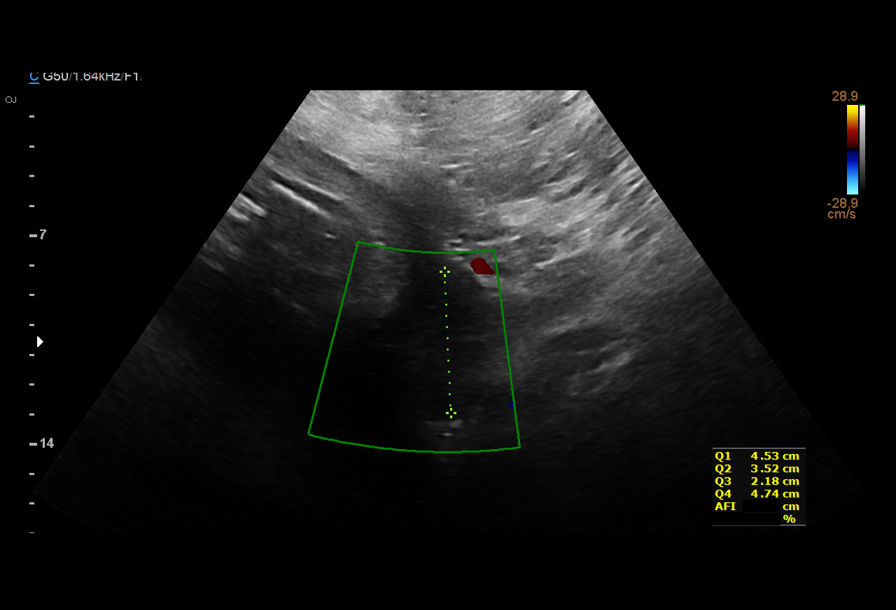
[im 12/26]
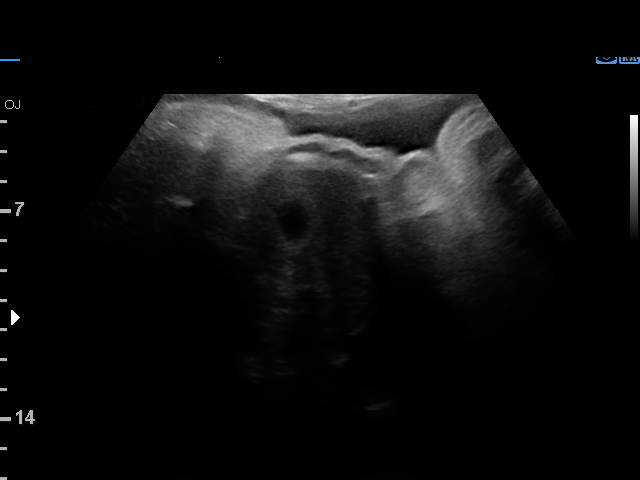
[im 14/26]
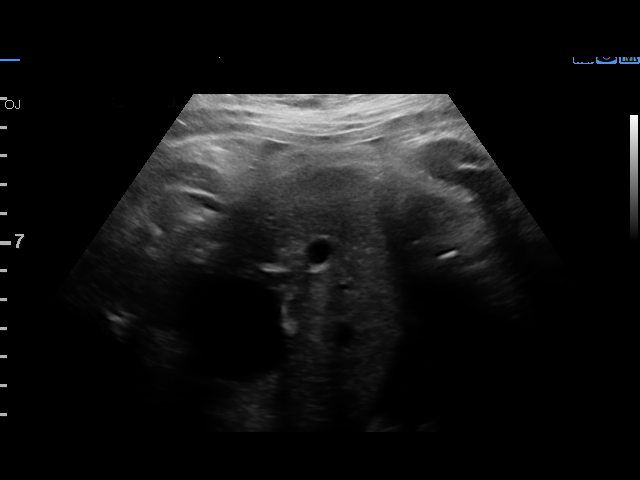
[im 16/26]
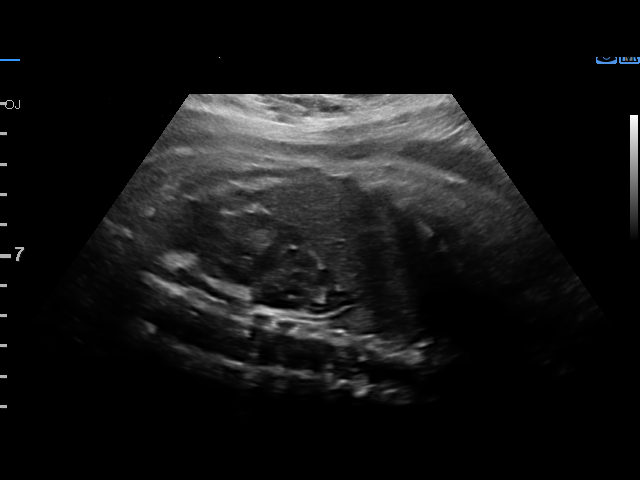
[im 18/26]
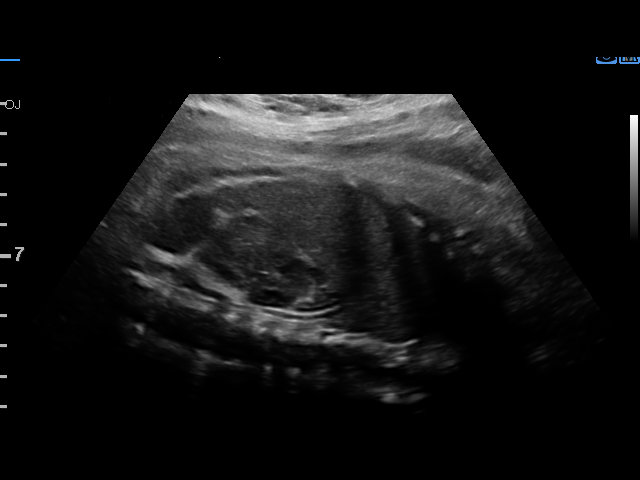
[im 20/26]
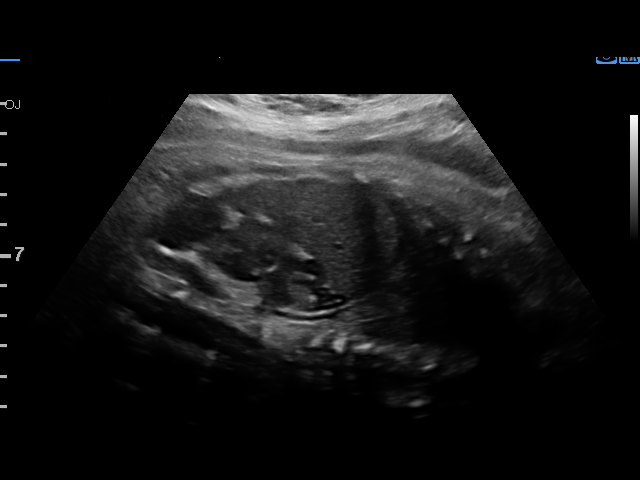
[im 22/26]
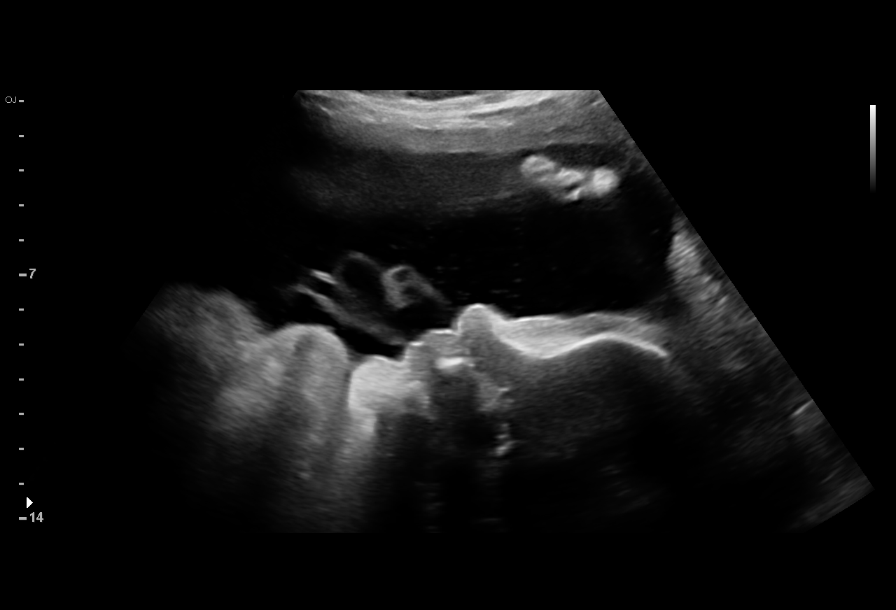
[im 24/26]
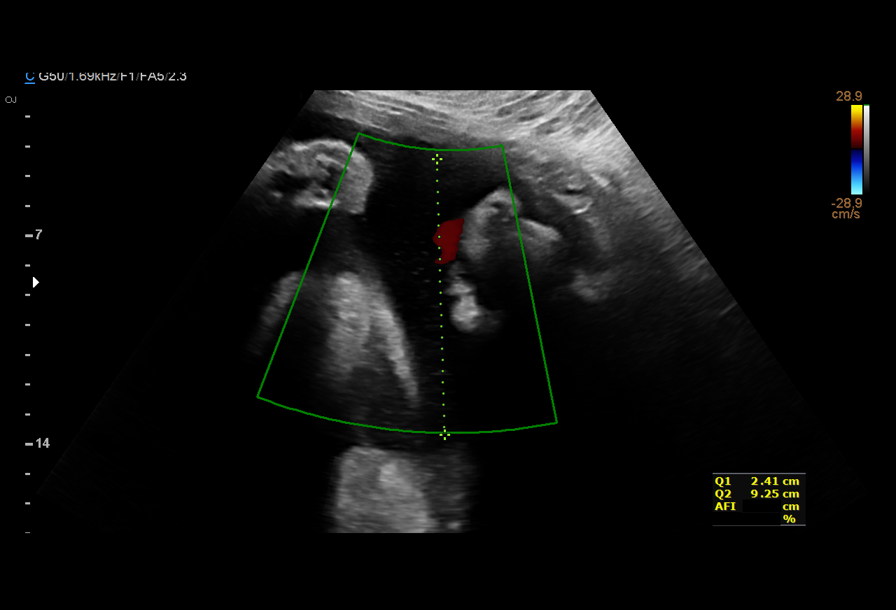
[im 26/26]
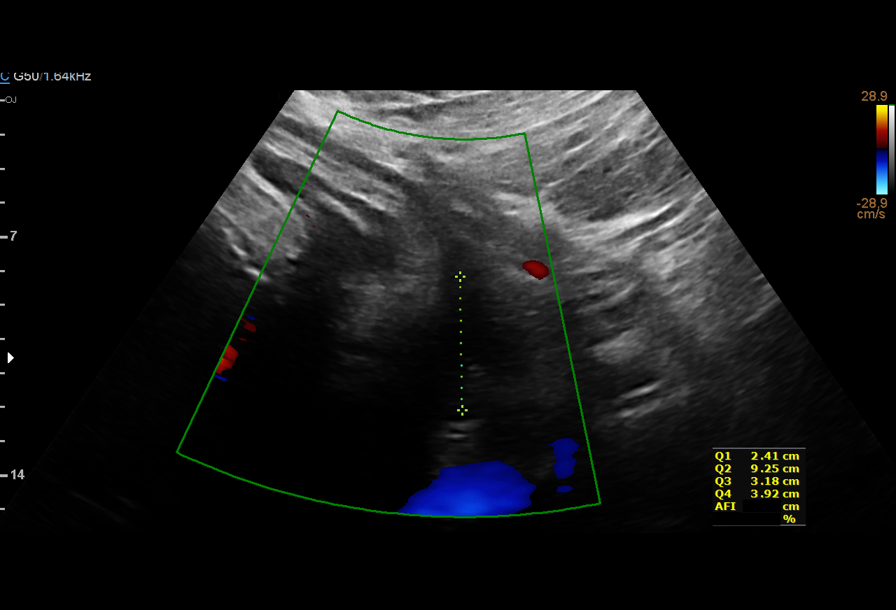

[13 of 26 positions shown; findings below may reference images not displayed]

Name:       EFFE ASIA                Visit Date: 07/01/2016 [DATE]

1  SIMOES DOS SANTOS DIDODO           [id number]      [id number]     [id number]
Indications

36 weeks gestation of pregnancy
Previous cesarean delivery, antepartum;
followed by VBAC
Obesity complicating pregnancy, third
trimester
Gestational diabetes in pregnancy,
controlled by oral hypoglycemic drugs -
metformin
OB History

Blood Type:            Height:  5'1"   Weight (lb):  225       BMI:
Gravidity:    4         Term:   2         SAB:   1
Living:       2
Fetal Evaluation

Num Of Fetuses:     1
Fetal Heart         171
Rate(bpm):
Cardiac Activity:   Observed
Presentation:       Cephalic

Amniotic Fluid
AFI FV:      Subjectively upper-normal

AFI Sum(cm)     %Tile       Largest Pocket(cm)
18.76           71

RUQ(cm)       RLQ(cm)       LUQ(cm)        LLQ(cm)
2.41
Biophysical Evaluation

Amniotic F.V:   Increased                  F. Tone:        Observed
F. Movement:    Observed                   Score:          [DATE]
F. Breathing:   Observed
Gestational Age

Best:          36w 6d     Det. By:  Early Ultrasound         EDD:   07/23/16
(12/30/15)
Impression

Single IUP at 36w 6d
Gestational diabetes on metformin
BPP [DATE]
Normal amniotic fluid volume
Recommendations

Continue antenatal testring as scheduled
Delivery at 39 weeks in the absence of other complications

## 2018-05-20 IMAGING — US US MFM OB LIMITED
1 series · 14 of 15 positions shown · non-contrast
Comparison: none

[Series 1: us mfm ob limited · 15 acquisitions, 14 frames shown]
[im 1/15]
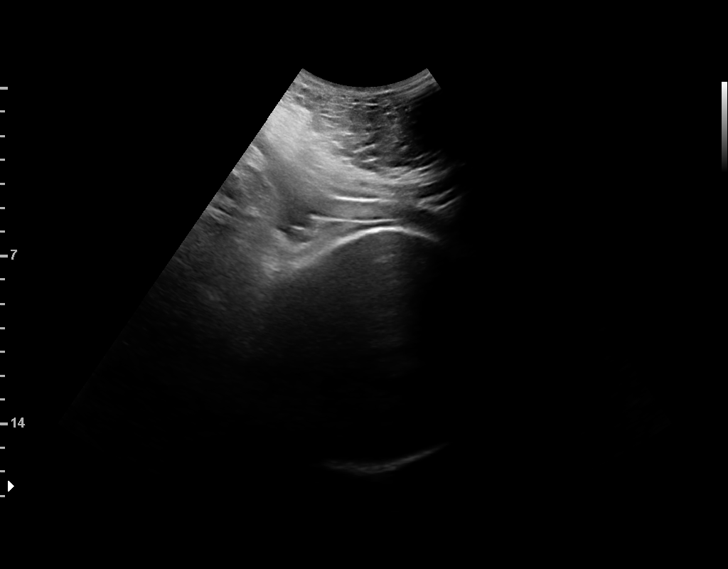
[im 2/15]
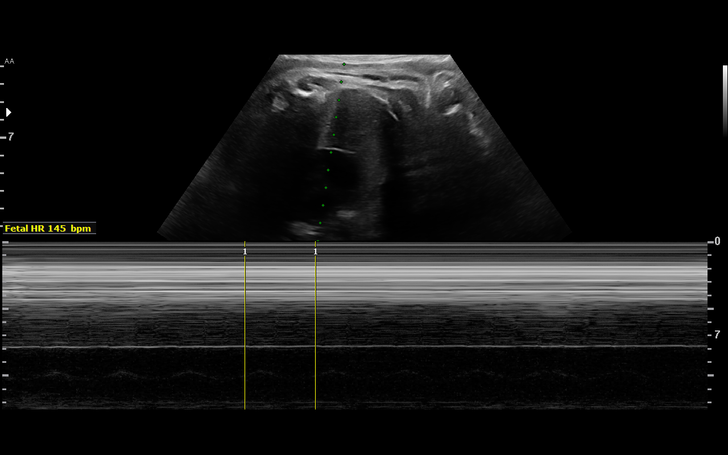
[im 3/15]
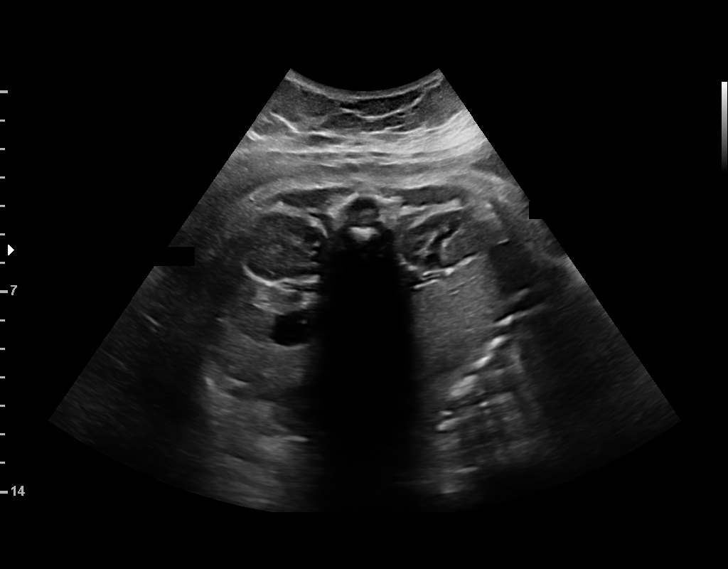
[im 4/15]
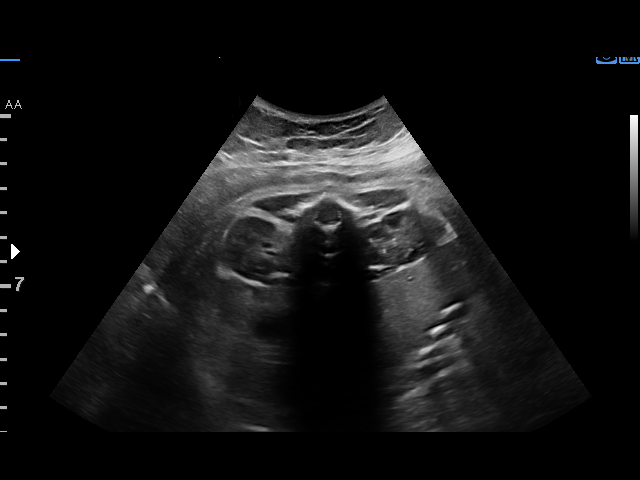
[im 5/15]
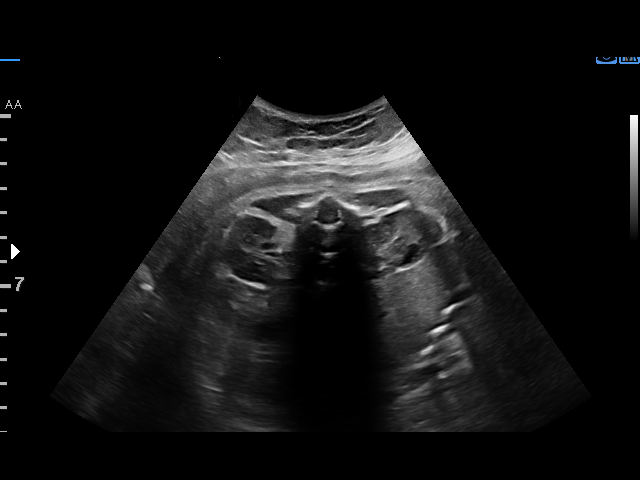
[im 6/15]
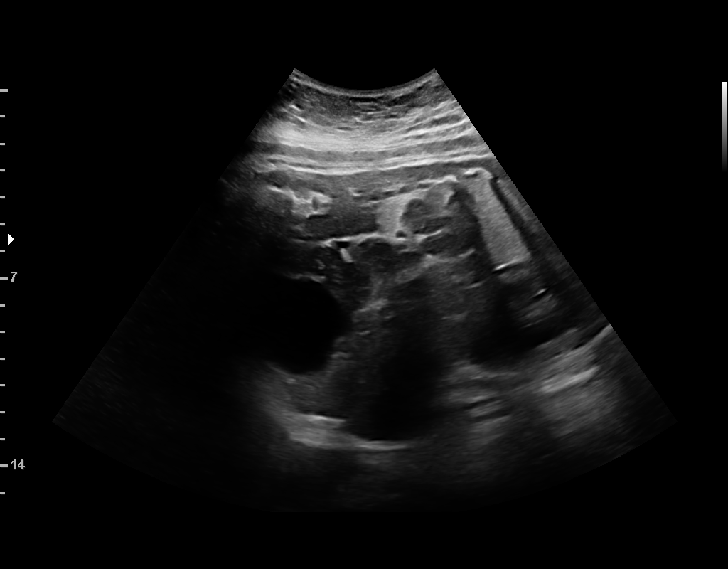
[im 7/15]
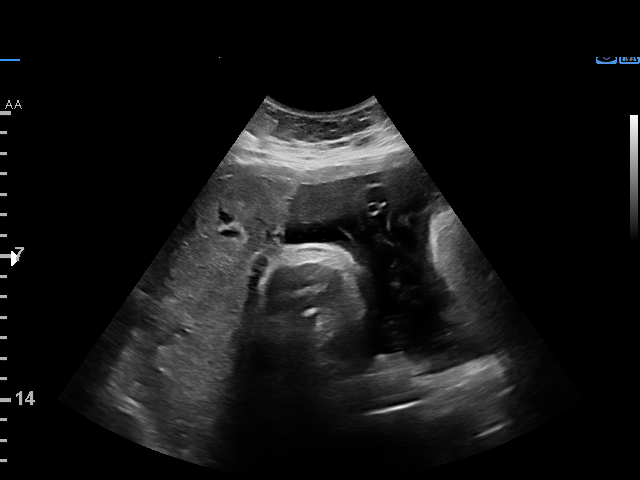
[im 9/15]
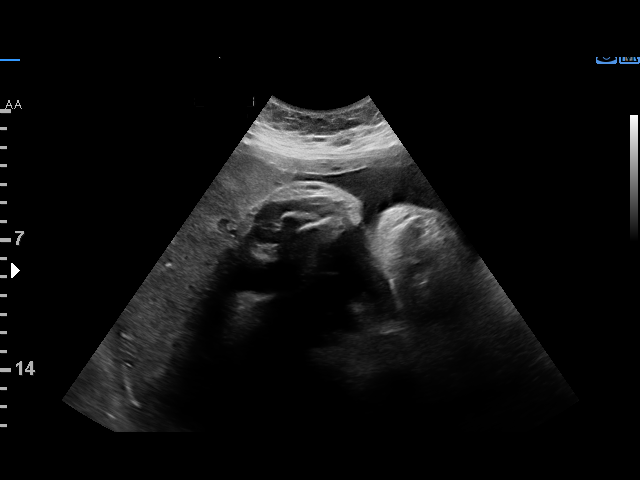
[im 10/15]
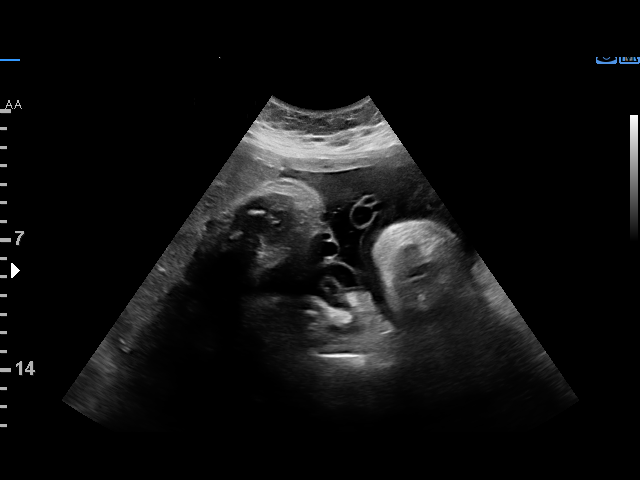
[im 11/15]
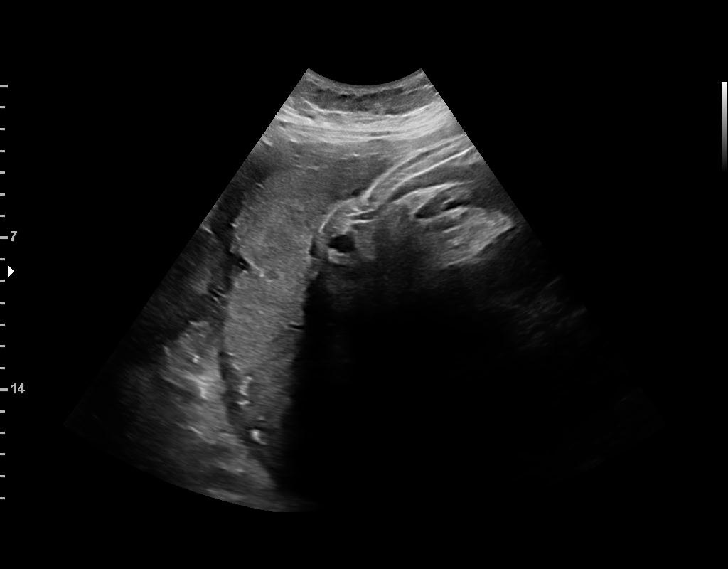
[im 12/15]
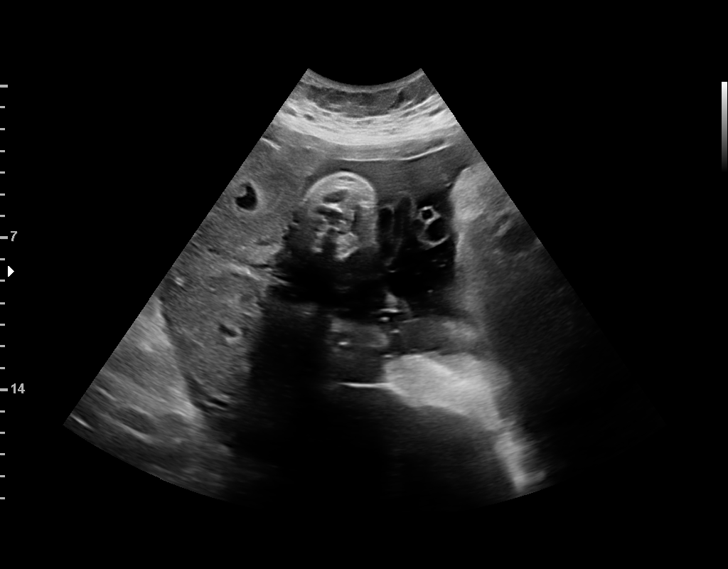
[im 13/15]
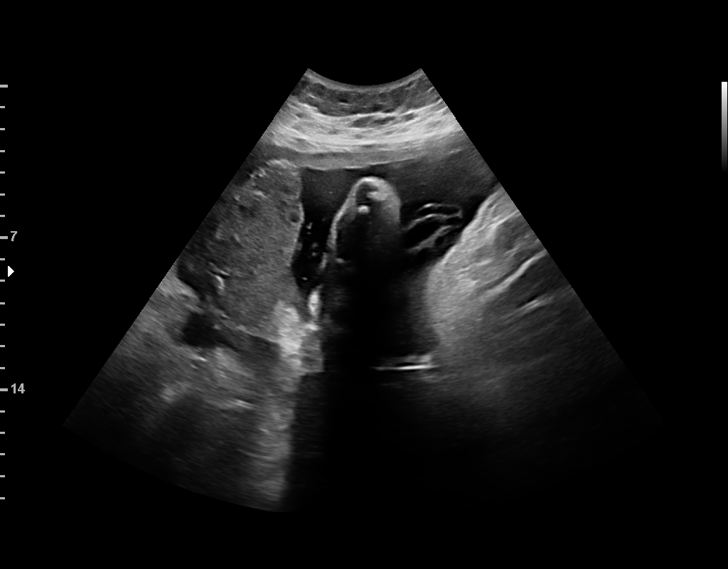
[im 14/15]
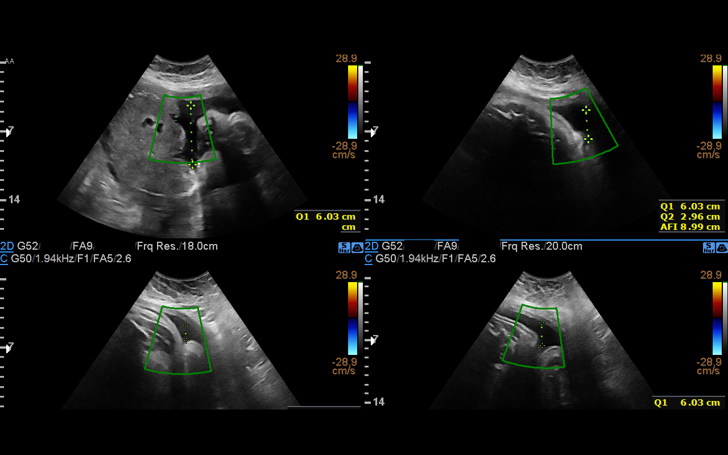
[im 15/15]
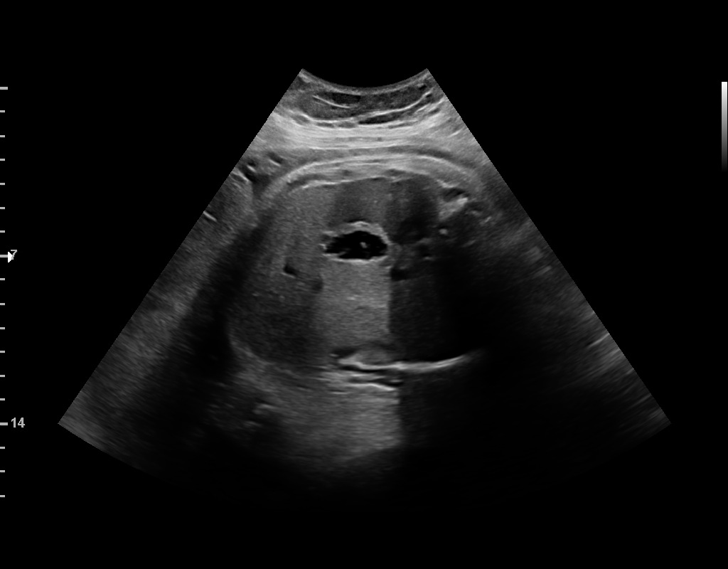

[14 of 15 positions shown; findings below may reference images not displayed]

Name:       QUIRIJN AMAZIGH               Visit Date:  07/08/2016 [DATE]

1  SANGEETA HARDIN            [id number]      [id number]     [id number]
Indications

37 weeks gestation of pregnancy
Previous cesarean delivery, antepartum;
followed by VBAC
Obesity complicating pregnancy, third
trimester
Gestational diabetes in pregnancy,
controlled by oral hypoglycemic drugs -
glyburide
OB History

Blood Type:            Height:  5'1"   Weight (lb):  225      BMI:
Gravidity:    4         Term:   2         SAB:   1
Living:       2
Fetal Evaluation

Num Of Fetuses:     1
Fetal Heart         145
Rate(bpm):
Cardiac Activity:   Observed
Presentation:       Cephalic
Placenta:           Posterior, above cervical os

Amniotic Fluid
AFI FV:      Subjectively within normal limits

AFI Sum(cm)     %Tile       Largest Pocket(cm)
13.12           49

RUQ(cm)       RLQ(cm)       LUQ(cm)        LLQ(cm)
6.03
Gestational Age

Best:          37w 6d    Det. By:   Early Ultrasound         EDD:   07/23/16
(12/30/15)
Impression

SIUP at 37+6 weeks
Cephalic presentation
Normal amniotic fluid volume
NST reactive
Recommendations

Continue twice weekly NSTs with weekly AFIs

## 2018-10-19 ENCOUNTER — Ambulatory Visit: Payer: Medicaid Other | Admitting: Family Medicine

## 2018-12-03 ENCOUNTER — Other Ambulatory Visit: Payer: Self-pay

## 2018-12-03 ENCOUNTER — Ambulatory Visit: Payer: Self-pay | Admitting: Family Medicine

## 2019-07-25 ENCOUNTER — Other Ambulatory Visit: Payer: Self-pay | Admitting: Cardiology

## 2019-07-25 DIAGNOSIS — Z20822 Contact with and (suspected) exposure to covid-19: Secondary | ICD-10-CM

## 2019-07-26 LAB — NOVEL CORONAVIRUS, NAA: SARS-CoV-2, NAA: NOT DETECTED

## 2020-03-19 ENCOUNTER — Other Ambulatory Visit: Payer: Self-pay

## 2020-03-19 DIAGNOSIS — Z20822 Contact with and (suspected) exposure to covid-19: Secondary | ICD-10-CM

## 2020-03-21 LAB — SARS-COV-2, NAA 2 DAY TAT

## 2020-03-21 LAB — SPECIMEN STATUS REPORT

## 2020-03-21 LAB — NOVEL CORONAVIRUS, NAA: SARS-CoV-2, NAA: NOT DETECTED

## 2021-05-10 ENCOUNTER — Other Ambulatory Visit: Payer: Self-pay

## 2021-05-10 ENCOUNTER — Emergency Department (HOSPITAL_COMMUNITY)
Admission: EM | Admit: 2021-05-10 | Discharge: 2021-05-11 | Disposition: A | Discharge status: Home / Self Care | Payer: Self-pay | Attending: Emergency Medicine | Admitting: Emergency Medicine

## 2021-05-10 ENCOUNTER — Emergency Department (HOSPITAL_COMMUNITY): Payer: Self-pay

## 2021-05-10 ENCOUNTER — Encounter (HOSPITAL_COMMUNITY): Payer: Self-pay | Admitting: Emergency Medicine

## 2021-05-10 DIAGNOSIS — Z87891 Personal history of nicotine dependence: Secondary | ICD-10-CM | POA: Insufficient documentation

## 2021-05-10 DIAGNOSIS — R0789 Other chest pain: Secondary | ICD-10-CM

## 2021-05-10 DIAGNOSIS — I4510 Unspecified right bundle-branch block: Secondary | ICD-10-CM | POA: Insufficient documentation

## 2021-05-10 LAB — CBC WITH DIFFERENTIAL/PLATELET
Abs Immature Granulocytes: 0.06 10*3/uL (ref 0.00–0.07)
Basophils Absolute: 0.1 10*3/uL (ref 0.0–0.1)
Basophils Relative: 1 %
Eosinophils Absolute: 0.4 10*3/uL (ref 0.0–0.5)
Eosinophils Relative: 3 %
HCT: 37.6 % (ref 36.0–46.0)
Hemoglobin: 12 g/dL (ref 12.0–15.0)
Immature Granulocytes: 1 %
Lymphocytes Relative: 33 %
Lymphs Abs: 3.6 10*3/uL (ref 0.7–4.0)
MCH: 25.7 pg — ABNORMAL LOW (ref 26.0–34.0)
MCHC: 31.9 g/dL (ref 30.0–36.0)
MCV: 80.5 fL (ref 80.0–100.0)
Monocytes Absolute: 0.7 10*3/uL (ref 0.1–1.0)
Monocytes Relative: 6 %
Neutro Abs: 6.1 10*3/uL (ref 1.7–7.7)
Neutrophils Relative %: 56 %
Platelets: 445 10*3/uL — ABNORMAL HIGH (ref 150–400)
RBC: 4.67 MIL/uL (ref 3.87–5.11)
RDW: 13.1 % (ref 11.5–15.5)
WBC: 10.9 10*3/uL — ABNORMAL HIGH (ref 4.0–10.5)
nRBC: 0 % (ref 0.0–0.2)

## 2021-05-10 LAB — COMPREHENSIVE METABOLIC PANEL
ALT: 58 U/L — ABNORMAL HIGH (ref 0–44)
AST: 41 U/L (ref 15–41)
Albumin: 3.6 g/dL (ref 3.5–5.0)
Alkaline Phosphatase: 96 U/L (ref 38–126)
Anion gap: 10 (ref 5–15)
BUN: 17 mg/dL (ref 6–20)
CO2: 26 mmol/L (ref 22–32)
Calcium: 9.1 mg/dL (ref 8.9–10.3)
Chloride: 101 mmol/L (ref 98–111)
Creatinine, Ser: 0.54 mg/dL (ref 0.44–1.00)
GFR, Estimated: >60 mL/min (ref >60)
Glucose, Bld: 110 mg/dL — ABNORMAL HIGH (ref 70–99)
Potassium: 3.7 mmol/L (ref 3.5–5.1)
Sodium: 137 mmol/L (ref 135–145)
Total Bilirubin: 0.7 mg/dL (ref 0.3–1.2)
Total Protein: 7.4 g/dL (ref 6.5–8.1)

## 2021-05-10 LAB — TROPONIN I (HIGH SENSITIVITY): Troponin I (High Sensitivity): <2 ng/L (ref <18)

## 2021-05-10 LAB — I-STAT BETA HCG BLOOD, ED (MC, WL, AP ONLY): I-stat hCG, quantitative: <5 m[IU]/mL (ref <5)

## 2021-05-10 NOTE — ED Triage Notes (Signed)
Patient reports left chest pain with SOB and nausea onset today radiating to upper back .

## 2021-05-10 NOTE — ED Provider Notes (Signed)
Emergency Medicine Provider Triage Evaluation Note  Kara Booker , a 30 y.o. female  was evaluated in triage.  Pt complains of chest pain that began earlier this AM. Anterior chest, occurring in the back as well, constant, no alleviating/aggravating factors. Associated nausea & dyspnea. Denies leg pain/swelling, hemoptysis, recent surgery/trauma, recent long travel, hormone use, personal hx of cancer, or hx of DVT/PE.    Review of Systems  Positive: Chest pain, dyspnea, nausea Negative: Vomiting, abdominal pain, cough, hemoptysis  Physical Exam  BP 131/88   Pulse 80   Temp 97.8 F (36.6 C)   Resp 15   Ht 5\' 1"  (1.549 m)   Wt 110 kg   LMP 04/26/2021   SpO2 100%   BMI 45.82 kg/m  Gen:   Awake, no distress   Resp:  Normal effort  MSK:   Moves extremities without difficulty  Other:  Heart RRR. Anterior chest wall TTP.   Medical Decision Making  Medically screening exam initiated at 10:10 PM.  Appropriate orders placed.  Kara Booker was informed that the remainder of the evaluation will be completed by another provider, this initial triage assessment does not replace that evaluation, and the importance of remaining in the ED until their evaluation is complete.  Chest pain.    Kara Booker 05/10/21 2213    2214, MD 05/10/21 2232

## 2021-05-11 LAB — TROPONIN I (HIGH SENSITIVITY): Troponin I (High Sensitivity): <2 ng/L (ref <18)

## 2021-05-11 NOTE — Discharge Instructions (Addendum)
You have been diagnosed by your caregiver as having chest wall pain. °SEEK IMMEDIATE MEDICAL ATTENTION IF: °You develop a fever.  °Your chest pains become severe or intolerable.  °You develop new, unexplained symptoms (problems).  °You develop shortness of breath, nausea, vomiting, sweating or feel light headed.  °You develop a new cough or you cough up blood. ° °

## 2021-05-11 NOTE — ED Notes (Signed)
Pt verbalized understanding of discharge instructions; opportunity for questions provided °

## 2021-05-11 NOTE — ED Provider Notes (Addendum)
[facility] EMERGENCY DEPARTMENT Provider Note   CSN: [id number] Arrival date & time: 05/10/21  2029     History Chief Complaint  Patient presents with   Chest Pain    Kara Booker is a 30 y.o. female with a past medical history of elevated BMI, gestational diabetes who presents emergency department chief complaint of left-sided chest pain.  She had onset of chest pain which she described as sharp followed by throbbing sensation in her anterior chest.  This then began radiating into the left upper back.  She felt tightness in her chest.  Then she states that it went across to her right upper back.  She states that she continues to have the sensation and has had it since 3 PM yesterday however it is less intense.  She denies diaphoresis, nausea, vomiting.  She states that she feels a little bit short of breath.  She denies any exertional component.  She denies a history of hypertension, hyperlipidemia, diabetes, smoking.  She has no family history of early MI.  She denies a history of blood clots, unilateral leg swelling.  She is not taking any birth control pills.  She rates her chest discomfort as mild.  She denies any recent fevers, chills, cough, body aches.   Chest Pain  HPI: A 30 year old patient with a history of obesity presents for evaluation of chest pain. Initial onset of pain was more than 6 hours ago. The patient's chest pain is described as heaviness/pressure/tightness, is sharp and is not worse with exertion. The patient's chest pain is middle- or left-sided, is not well-localized and does radiate to the arms/jaw/neck. The patient does not complain of nausea and denies diaphoresis. The patient has no history of stroke, has no history of peripheral artery disease, has not smoked in the past 90 days, denies any history of treated diabetes, has no relevant family history of coronary artery disease (first degree relative at less than age 12), is not hypertensive and  has no history of hypercholesterolemia.   Past Medical History:  Diagnosis Date   Anemia    Gestational diabetes mellitus 04/28/2016   Kidney stones    Kidney stones    NVD (normal vaginal delivery) 01/17/2011   Pregnancy induced hypertension     Patient Active Problem List   Diagnosis Date Noted   Acute blood loss anemia 07/21/2016   GDM, class A2 07/16/2016   Gestational diabetes mellitus 04/28/2016   Supervision of other normal pregnancy, antepartum 02/02/2016   History of cesarean section 02/02/2016   Hx of preeclampsia, prior pregnancy, currently pregnant 02/02/2016   Overweight 03/27/2013   Acne 03/27/2013    Past Surgical History:  Procedure Laterality Date   CESAREAN SECTION     CESAREAN SECTION N/A 07/17/2016   Procedure: CESAREAN SECTION;  Surgeon: Lazaro Arms, MD;  Location: [facility] BIRTHING SUITES;  Service: Obstetrics;  Laterality: N/A;   KIDNEY STONE SURGERY       OB History     Gravida  4   Para  3   Term  3   Preterm      AB  1   Living  3      SAB  1   IAB      Ectopic      Multiple  0   Live Births  3           Family History  Problem Relation Age of Onset   Hyperlipidemia Maternal Uncle    Diabetes  Maternal Uncle    Cancer Maternal Uncle        LEUKEMIA   Hyperlipidemia Mother    Hyperlipidemia Father    Hyperlipidemia Paternal Uncle    Diabetes Paternal Uncle    Cancer Cousin     Social History   Tobacco Use   Smoking status: Former    Packs/day: 0.10    Types: Cigarettes    Quit date: 12/16/2010    Years since quitting: 10.4   Smokeless tobacco: Never  Substance Use Topics   Alcohol use: No    Comment: occ   Drug use: No    Home Medications Prior to Admission medications   Not on File    Allergies    Patient has no known allergies.  Review of Systems   Review of Systems  Cardiovascular:  Positive for chest pain.  Ten systems reviewed and are negative for acute change, except as noted in the HPI.    Physical Exam Updated Vital Signs BP (!) 133/94   Pulse 77   Temp 98 F (36.7 C)   Resp 11   Ht 5\' 1"  (1.549 m)   Wt 110 kg   LMP 04/26/2021   SpO2 98%   BMI 45.82 kg/m   Physical Exam Vitals and nursing note reviewed.  Constitutional:      General: She is not in acute distress.    Appearance: She is well-developed. She is not diaphoretic.  HENT:     Head: Normocephalic and atraumatic.     Right Ear: External ear normal.     Left Ear: External ear normal.     Nose: Nose normal.     Mouth/Throat:     Mouth: Mucous membranes are moist.  Eyes:     General: No scleral icterus.    Conjunctiva/sclera: Conjunctivae normal.  Cardiovascular:     Rate and Rhythm: Normal rate and regular rhythm.     Heart sounds: Normal heart sounds. No murmur heard.   No friction rub. No gallop.  Pulmonary:     Effort: Pulmonary effort is normal. No respiratory distress.     Breath sounds: Normal breath sounds.  Chest:     Chest wall: Tenderness present.    Abdominal:     General: Bowel sounds are normal. There is no distension.     Palpations: Abdomen is soft. There is no mass.     Tenderness: There is no abdominal tenderness. There is no guarding.  Musculoskeletal:     Cervical back: Normal range of motion.     Right lower leg: No edema.     Left lower leg: No edema.  Skin:    General: Skin is warm and dry.  Neurological:     Mental Status: She is alert and oriented to person, place, and time.  Psychiatric:        Behavior: Behavior normal.    ED Results / Procedures / Treatments   Labs (all labs ordered are listed, but only abnormal results are displayed) Labs Reviewed  COMPREHENSIVE METABOLIC PANEL - Abnormal; Notable for the following components:      Result Value   Glucose, Bld 110 (*)    ALT 58 (*)    All other components within normal limits  CBC WITH DIFFERENTIAL/PLATELET - Abnormal; Notable for the following components:   WBC 10.9 (*)    MCH 25.7 (*)     Platelets 445 (*)    All other components within normal limits  I-STAT BETA HCG BLOOD, ED (  [facility], [facility], [facility] ONLY)  TROPONIN I (HIGH SENSITIVITY)  TROPONIN I (HIGH SENSITIVITY)    EKG EKG Interpretation  Date/Time:  Monday May 10 2021 21:56:36 EST Ventricular Rate:  84 PR Interval:  168 QRS Duration: 96 QT Interval:  372 QTC Calculation: 439 R Axis:   94 Text Interpretation: Normal sinus rhythm Rightward axis Incomplete right bundle branch block Confirmed by Randal Buba, April ([id number]) on 05/11/2021 7:56:49 AM  Radiology DG Chest 2 View  Result Date: 05/10/2021 CLINICAL DATA:  Chest pain. EXAM: CHEST - 2 VIEW COMPARISON:  02/28/2008. FINDINGS: The Cardiac silhouette is prominent. The pulmonary vasculature is within normal limits. No consolidation, effusion, or pneumothorax is seen. No acute osseous abnormality is identified. Surgical clips are present in the right upper quadrant. IMPRESSION: Prominent cardiac silhouette with no acute abnormality. Electronically Signed   By: Brett Fairy M.D.   On: 05/10/2021 22:48    Procedures Procedures   Medications Ordered in ED Medications - No data to display  ED Course  I have reviewed the triage vital signs and the nursing notes.  Pertinent labs & imaging results that were available during my care of the patient were reviewed by me and considered in my medical decision making (see chart for details).    MDM Rules/Calculators/A&P HEAR Score: 3                           Patient here with complaint of chest pain.  I suspect that this is musculoskeletal based on physical examination, work-up and risk factors.  Patient's heart score is low. I reviewed labs ordered at triage, CBC shows mildly elevated white blood cell count, CMP with mildly elevated glucose likely acute phase reaction.  Troponin is negative x2, negative i-STAT hCG.  I personally reviewed images of a 2 view chest x-ray which show no acute abnormalities.  EKG shows sinus rhythm  with a right bundle branch block.  There is no old EKG to review. Patient is PERC negative.  Patient advised to follow about her elevated blood sugar reading today.  Given the large differential diagnosis for [employer], the decision making in this case is of high complexity.  After evaluating all of the data points in this case, the presentation of Kara Booker is NOT consistent with Acute Coronary Syndrome (ACS) and/or myocardial ischemia, pulmonary embolism, aortic dissection; Kara Booker, significant arrythmia, pneumothorax, cardiac tamponade, or other emergent cardiopulmonary condition.  Further, the presentation of Kara Booker is NOT consistent with pericarditis, myocarditis, cholecystitis, pancreatitis, mediastinitis, endocarditis, new valvular disease.  Additionally, the presentation of Kara Booker NOT consistent with flail chest, cardiac contusion, ARDS, or significant intra-thoracic or intra-abdominal bleeding.  Moreover, this presentation is NOT consistent with pneumonia, sepsis, or pyelonephritis.    Strict return and follow-up precautions have been given by me personally or by detailed written instruction given verbally by nursing staff using the teach back method to the patient/family/caregiver(s).  Data Reviewed/Counseling: I have reviewed the patient's vital signs, nursing notes, and other relevant tests/information. I had a detailed discussion regarding the historical points, exam findings, and any diagnostic results supporting the discharge diagnosis. I also discussed the need for outpatient follow-up and the need to return to the ED if symptoms worsen or if there are any questions or concerns that arise at home.  Final Clinical Impression(s) / ED Diagnoses Final diagnoses:  Chest wall pain  Right bundle branch block    Rx / DC Orders ED Discharge Orders  None        Margarita Mail, PA-C 05/11/21 0824    Margarita Mail, PA-C 05/11/21  Jacqulyn Liner, MD 05/11/21 1323

## 2022-04-27 ENCOUNTER — Ambulatory Visit: Payer: Self-pay

## 2022-04-27 DIAGNOSIS — Z23 Encounter for immunization: Secondary | ICD-10-CM

## 2022-05-16 ENCOUNTER — Ambulatory Visit: Payer: Self-pay

## 2022-05-16 ENCOUNTER — Ambulatory Visit
Admission: EM | Admit: 2022-05-16 | Discharge: 2022-05-16 | Disposition: A | Discharge status: Home / Self Care | Payer: Self-pay | Attending: Physician Assistant | Admitting: Physician Assistant

## 2022-05-16 DIAGNOSIS — G43919 Migraine, unspecified, intractable, without status migrainosus: Secondary | ICD-10-CM

## 2022-05-16 MED ORDER — DEXAMETHASONE SODIUM PHOSPHATE 10 MG/ML IJ SOLN
10.0000 mg | Freq: Once | INTRAMUSCULAR | Status: AC
  Administered 2022-05-16: 10 mg via INTRAVENOUS

## 2022-05-16 MED ORDER — SUMATRIPTAN SUCCINATE 50 MG PO TABS: 50.0000 mg | ORAL_TABLET | ORAL | 0 refills | Status: DC | PRN

## 2022-05-16 MED ORDER — KETOROLAC TROMETHAMINE 30 MG/ML IJ SOLN
30.0000 mg | Freq: Once | INTRAMUSCULAR | Status: AC
  Administered 2022-05-16: 30 mg via INTRAMUSCULAR

## 2022-05-16 MED ORDER — ONDANSETRON HCL 4 MG PO TABS: 4.0000 mg | ORAL_TABLET | Freq: Three times a day (TID) | ORAL | 0 refills | Status: DC | PRN

## 2022-05-16 NOTE — Discharge Instructions (Addendum)
Can take Imitrex as needed for headaches.  Recommend 50 mg and if no improvement may repeat after 2 hours.  Do not exceed more than 200mg  per day.   If headaches persist follow up with primary care physician for management.  Drink plenty of fluids, rest, can apply ice pack

## 2022-05-16 NOTE — ED Triage Notes (Signed)
Pt c/o headache intermittent for 3 months. States for last 2-3 weeks has been constant. Says in last 3 days left side of face has been numb. Associated nausea, left-side vision blurry.

## 2022-05-16 NOTE — ED Provider Notes (Addendum)
[facility]    CSN: [id number] Arrival date & time: 05/16/22  1449      History   Chief Complaint Chief Complaint  Patient presents with   Migraine    HPI Kara Booker is a 31 y.o. female.   Pt complains of a headache.  Reports h/o migraines.  She has been experiencing intermittent headaches for the last few months.  Reports she has been under more stress lately which may be triggering her headaches.  Reports photophobia and phonophobia.  Describes sometimes blurry vision.  She denies visual changes, weakness, trouble with balance, syncope.  She reports intermittent nausea.  Denies current nausea.  She has taken Tylenol today, about 8 hours ago.  She rates her current headache as 9/10.     Past Medical History:  Diagnosis Date   Anemia    Gestational diabetes mellitus 04/28/2016   Kidney stones    Kidney stones    NVD (normal vaginal delivery) 01/17/2011   Pregnancy induced hypertension     Patient Active Problem List   Diagnosis Date Noted   Acute blood loss anemia 07/21/2016   GDM, class A2 07/16/2016   Gestational diabetes mellitus 04/28/2016   Supervision of other normal pregnancy, antepartum 02/02/2016   History of cesarean section 02/02/2016   Hx of preeclampsia, prior pregnancy, currently pregnant 02/02/2016   Overweight 03/27/2013   Acne 03/27/2013    Past Surgical History:  Procedure Laterality Date   CESAREAN SECTION     CESAREAN SECTION N/A 07/17/2016   Procedure: CESAREAN SECTION;  Surgeon: Lazaro Arms, MD;  Location: [facility] BIRTHING SUITES;  Service: Obstetrics;  Laterality: N/A;   KIDNEY STONE SURGERY      OB History     Gravida  4   Para  3   Term  3   Preterm      AB  1   Living  3      SAB  1   IAB      Ectopic      Multiple  0   Live Births  3            Home Medications    Prior to Admission medications   Medication Sig Start Date End Date Taking? Authorizing Provider  ondansetron (ZOFRAN) 4 MG  tablet Take 1 tablet (4 mg total) by mouth every 8 (eight) hours as needed for nausea or vomiting. 05/16/22  Yes Ward, Tylene Fantasia, PA-C  SUMAtriptan (IMITREX) 50 MG tablet Take 1 tablet (50 mg total) by mouth every 2 (two) hours as needed for migraine. May repeat in 2 hours if headache persists or recurs. 05/16/22  Yes Ward, Tylene Fantasia, PA-C    Family History Family History  Problem Relation Age of Onset   Hyperlipidemia Maternal Uncle    Diabetes Maternal Uncle    Cancer Maternal Uncle        LEUKEMIA   Hyperlipidemia Mother    Hyperlipidemia Father    Hyperlipidemia Paternal Uncle    Diabetes Paternal Uncle    Cancer Cousin     Social History Social History   Tobacco Use   Smoking status: Former    Packs/day: 0.10    Types: Cigarettes    Quit date: 12/16/2010    Years since quitting: 11.4   Smokeless tobacco: Never  Substance Use Topics   Alcohol use: No    Comment: occ   Drug use: No     Allergies   Patient has no known allergies.  Review of Systems Review of Systems  Constitutional:  Negative for chills and fever.  HENT:  Negative for ear pain and sore throat.   Eyes:  Positive for photophobia. Negative for pain and visual disturbance.  Respiratory:  Negative for cough and shortness of breath.   Cardiovascular:  Negative for chest pain and palpitations.  Gastrointestinal:  Positive for nausea. Negative for abdominal pain and vomiting.  Genitourinary:  Negative for dysuria and hematuria.  Musculoskeletal:  Negative for arthralgias and back pain.  Skin:  Negative for color change and rash.  Neurological:  Positive for headaches. Negative for seizures and syncope.  All other systems reviewed and are negative.    Physical Exam Triage Vital Signs ED Triage Vitals [05/16/22 1513]  Enc Vitals Group     BP 113/78     Pulse Rate 83     Resp 16     Temp 98.7 F (37.1 C)     Temp Source Oral     SpO2 97 %     Weight      Height      Head Circumference       Peak Flow      Pain Score 8     Pain Loc      Pain Edu?      Excl. in GC?    No data found.  Updated Vital Signs BP 113/78 (BP Location: Left Arm)   Pulse 83   Temp 98.7 F (37.1 C) (Oral)   Resp 16   SpO2 97%   Breastfeeding No   Visual Acuity Right Eye Distance:   Left Eye Distance:   Bilateral Distance:    Right Eye Near:   Left Eye Near:    Bilateral Near:     Physical Exam Vitals and nursing note reviewed.  Constitutional:      General: She is not in acute distress.    Appearance: She is well-developed.  HENT:     Head: Normocephalic and atraumatic.  Eyes:     Conjunctiva/sclera: Conjunctivae normal.  Cardiovascular:     Rate and Rhythm: Normal rate and regular rhythm.     Heart sounds: No murmur heard. Pulmonary:     Effort: Pulmonary effort is normal. No respiratory distress.     Breath sounds: Normal breath sounds.  Abdominal:     Palpations: Abdomen is soft.     Tenderness: There is no abdominal tenderness.  Musculoskeletal:        General: No swelling.     Cervical back: Neck supple.  Skin:    General: Skin is warm and dry.     Capillary Refill: Capillary refill takes less than 2 seconds.  Neurological:     Mental Status: She is alert.  Psychiatric:        Mood and Affect: Mood normal.      UC Treatments / Results  Labs (all labs ordered are listed, but only abnormal results are displayed) Labs Reviewed - No data to display  EKG   Radiology No results found.  Procedures Procedures (including critical care time)  Medications Ordered in UC Medications  ketorolac (TORADOL) 30 MG/ML injection 30 mg (30 mg Intramuscular Given 05/16/22 1642)  dexamethasone (DECADRON) injection 10 mg (10 mg Intravenous Given 05/16/22 1642)    Initial Impression / Assessment and Plan / UC Course  I have reviewed the triage vital signs and the nursing notes.  Pertinent labs & imaging results that were available during my care of the patient  were  reviewed by me and considered in my medical decision making (see chart for details).     Migraines.  Neuro exam normal.  Toradol and decadron given in clinic today.  She reports improvement here today. Will send in zofran for patient to take as needed for nausea.  ED precautions given.  Pt overall well appearing in no acute distress, stable for discharge.   Final Clinical Impressions(s) / UC Diagnoses   Final diagnoses:  Intractable migraine without status migrainosus, unspecified migraine type     Discharge Instructions      Can take Imitrex as needed for headaches.  Recommend 50 mg and if no improvement may repeat after 2 hours.  Do not exceed more than 200mg  per day.   If headaches persist follow up with primary care physician for management.      ED Prescriptions     Medication Sig Dispense Auth. Provider   ondansetron (ZOFRAN) 4 MG tablet Take 1 tablet (4 mg total) by mouth every 8 (eight) hours as needed for nausea or vomiting. 20 tablet Ward, , PA-C   SUMAtriptan (IMITREX) 50 MG tablet Take 1 tablet (50 mg total) by mouth every 2 (two) hours as needed for migraine. May repeat in 2 hours if headache persists or recurs. 10 tablet Ward, Tylene Fantasia, PA-C      PDMP not reviewed this encounter.   Ward, Tylene Fantasia, PA-C 05/16/22 1650    Ward, 05/18/22, PA-C 05/16/22 1706

## 2022-05-17 ENCOUNTER — Encounter: Payer: Self-pay | Admitting: Emergency Medicine

## 2022-05-25 ENCOUNTER — Other Ambulatory Visit: Payer: Self-pay

## 2022-05-25 ENCOUNTER — Ambulatory Visit: Payer: Self-pay | Attending: Nurse Practitioner | Admitting: Nurse Practitioner

## 2022-05-25 ENCOUNTER — Encounter: Payer: Self-pay | Admitting: Nurse Practitioner

## 2022-05-25 VITALS — BP 117/82 | HR 82 | Ht 61.0 in | Wt 223.4 lb

## 2022-05-25 DIAGNOSIS — Z1159 Encounter for screening for other viral diseases: Secondary | ICD-10-CM

## 2022-05-25 DIAGNOSIS — Z862 Personal history of diseases of the blood and blood-forming organs and certain disorders involving the immune mechanism: Secondary | ICD-10-CM

## 2022-05-25 DIAGNOSIS — G43E09 Chronic migraine with aura, not intractable, without status migrainosus: Secondary | ICD-10-CM

## 2022-05-25 DIAGNOSIS — N926 Irregular menstruation, unspecified: Secondary | ICD-10-CM

## 2022-05-25 DIAGNOSIS — Z8632 Personal history of gestational diabetes: Secondary | ICD-10-CM

## 2022-05-25 DIAGNOSIS — Z7689 Persons encountering health services in other specified circumstances: Secondary | ICD-10-CM

## 2022-05-25 MED ORDER — TOPIRAMATE 50 MG PO TABS
50.0000 mg | ORAL_TABLET | Freq: Every evening | ORAL | 1 refills | Status: DC
  Filled 2022-05-25: qty 90, 90d supply, fill #0
  Filled 2022-12-19 – 2023-02-10 (×2): qty 90, 90d supply, fill #1

## 2022-05-25 NOTE — Progress Notes (Signed)
Assessment & Plan:  Kara Booker was seen today for establish care.  Diagnoses and all orders for this visit:  Encounter to establish care  Irregular menstruation -     Thyroid Panel With TSH  History of gestational diabetes -     CMP14+EGFR -     Hemoglobin A1c  Need for hepatitis C screening test -     HCV Ab w Reflex to Quant PCR  History of anemia -     CBC with Differential  Chronic migraine with aura without status migrainosus, not intractable -     topiramate (TOPAMAX) 50 MG tablet; Take 1 tablet (50 mg total) by mouth at bedtime. For migraines    Patient has been counseled on age-appropriate routine health concerns for screening and prevention. These are reviewed and up-to-date. Referrals have been placed accordingly. Immunizations are up-to-date or declined.    Subjective:   Chief Complaint  Patient presents with   Establish Care    Migraines, onset July.   HPI Kara Booker 31 y.o. female presents to office today to establish care.   She has a past medical history of Anemia, Gestational diabetes mellitus (04/28/2016), Gestational diabetes mellitus (04/28/2016), Kidney stones, Kidney stones, NVD (normal vaginal delivery) (01/17/2011), and Pregnancy induced hypertension.   She reports a longstanding history (11 years) End of October beginning of November. She has a history of irregular menstrual cycles for 11 years. Bleeding intensity varies.   Headaches She is currently taking imitrex and it takes a while for it be effective. Also has headaches every day and usually on the left side and cause visual disturbances. Triggers: stress Blood pressure is well controlled.  BP Readings from Last 3 Encounters:  05/25/22 117/82  05/16/22 113/78  05/11/21 125/84    Review of Systems  Constitutional:  Negative for fever, malaise/fatigue and weight loss.  HENT: Negative.  Negative for nosebleeds.   Eyes: Negative.  Negative for blurred vision, double vision and  photophobia.  Respiratory: Negative.  Negative for cough and shortness of breath.   Cardiovascular: Negative.  Negative for chest pain, palpitations and leg swelling.  Gastrointestinal:  Positive for nausea. Negative for abdominal pain, blood in stool, constipation, diarrhea, heartburn, melena and vomiting.  Musculoskeletal: Negative.  Negative for myalgias.  Neurological:  Positive for headaches. Negative for dizziness, focal weakness and seizures.  Psychiatric/Behavioral: Negative.  Negative for suicidal ideas.     Past Medical History:  Diagnosis Date   Anemia    Gestational diabetes mellitus 04/28/2016   Gestational diabetes mellitus 04/28/2016   Kidney stones    Kidney stones    NVD (normal vaginal delivery) 01/17/2011   Pregnancy induced hypertension     Past Surgical History:  Procedure Laterality Date   CESAREAN SECTION     CESAREAN SECTION N/A 07/17/2016   Procedure: CESAREAN SECTION;  Surgeon: Florian Buff, MD;  Location: [place];  Service: Obstetrics;  Laterality: N/A;   KIDNEY STONE SURGERY      Family History  Problem Relation Age of Onset   Hyperlipidemia Maternal Uncle    Diabetes Maternal Uncle    Cancer Maternal Uncle        LEUKEMIA   Hyperlipidemia Mother    Hyperlipidemia Father    Hyperlipidemia Paternal Uncle    Diabetes Paternal Uncle    Cancer Cousin     Social History Reviewed with no changes to be made today.   Outpatient Medications Prior to Visit  Medication Sig Dispense Refill   ondansetron (  ZOFRAN) 4 MG tablet Take 1 tablet (4 mg total) by mouth every 8 (eight) hours as needed for nausea or vomiting. 20 tablet 0   SUMAtriptan (IMITREX) 50 MG tablet Take 1 tablet (50 mg total) by mouth every 2 (two) hours as needed for migraine. May repeat in 2 hours if headache persists or recurs. 10 tablet 0   No facility-administered medications prior to visit.    No Known Allergies     Objective:    BP 117/82   Pulse 82   Ht _0   (1.549 m)   Wt 223 lb 6.4 oz (101.3 kg)   LMP 05/04/2022 (Exact Date)   SpO2 92%   BMI 42.21 kg/m  Wt Readings from Last 3 Encounters:  05/25/22 223 lb 6.4 oz (101.3 kg)  05/10/21 242 lb 8.1 oz (110 kg)  08/03/16 202 lb (91.6 kg)    Physical Exam Vitals and nursing note reviewed.  Constitutional:      Appearance: She is well-developed.  HENT:     Head: Normocephalic and atraumatic.  Cardiovascular:     Rate and Rhythm: Normal rate and regular rhythm.     Heart sounds: Normal heart sounds. No murmur heard.    No friction rub. No gallop.  Pulmonary:     Effort: Pulmonary effort is normal. No tachypnea or respiratory distress.     Breath sounds: Normal breath sounds. No decreased breath sounds, wheezing, rhonchi or rales.  Chest:     Chest wall: No tenderness.  Abdominal:     General: Bowel sounds are normal.     Palpations: Abdomen is soft.  Musculoskeletal:        General: Normal range of motion.     Cervical back: Normal range of motion.  Skin:    General: Skin is warm and dry.  Neurological:     Mental Status: She is alert and oriented to person, place, and time.     Coordination: Coordination normal.  Psychiatric:        Behavior: Behavior normal. Behavior is cooperative.        Thought Content: Thought content normal.        Judgment: Judgment normal.          Patient has been counseled extensively about nutrition and exercise as well as the importance of adherence with medications and regular follow-up. The patient was given clear instructions to go to ER or return to medical center if symptoms don't improve, worsen or new problems develop. The patient verbalized understanding.   Follow-up: Return in about 3 months (around 08/25/2022).   Gildardo Pounds, FNP-BC [facility] and [facility], [place]   05/25/2022, 2:22 PM

## 2022-05-26 LAB — CMP14+EGFR
ALT: 61 IU/L — ABNORMAL HIGH (ref 0–32)
AST: 62 IU/L — ABNORMAL HIGH (ref 0–40)
Albumin/Globulin Ratio: 1.3 (ref 1.2–2.2)
Albumin: 4.6 g/dL (ref 3.9–4.9)
Alkaline Phosphatase: 121 IU/L (ref 44–121)
BUN/Creatinine Ratio: 21 (ref 9–23)
BUN: 13 mg/dL (ref 6–20)
Bilirubin Total: 0.4 mg/dL (ref 0.0–1.2)
CO2: 25 mmol/L (ref 20–29)
Calcium: 10 mg/dL (ref 8.7–10.2)
Chloride: 99 mmol/L (ref 96–106)
Creatinine, Ser: 0.63 mg/dL (ref 0.57–1.00)
Globulin, Total: 3.6 g/dL (ref 1.5–4.5)
Glucose: 116 mg/dL — ABNORMAL HIGH (ref 70–99)
Potassium: 4.8 mmol/L (ref 3.5–5.2)
Sodium: 138 mmol/L (ref 134–144)
Total Protein: 8.2 g/dL (ref 6.0–8.5)
eGFR: 120 mL/min/{1.73_m2} (ref >59)

## 2022-05-26 LAB — CBC WITH DIFFERENTIAL/PLATELET
Basophils Absolute: 0.1 10*3/uL (ref 0.0–0.2)
Basos: 1 %
EOS (ABSOLUTE): 0.3 10*3/uL (ref 0.0–0.4)
Eos: 3 %
Hematocrit: 38.6 % (ref 34.0–46.6)
Hemoglobin: 12.8 g/dL (ref 11.1–15.9)
Immature Grans (Abs): 0.1 10*3/uL (ref 0.0–0.1)
Immature Granulocytes: 1 %
Lymphocytes Absolute: 3.3 10*3/uL — ABNORMAL HIGH (ref 0.7–3.1)
Lymphs: 31 %
MCH: 26.1 pg — ABNORMAL LOW (ref 26.6–33.0)
MCHC: 33.2 g/dL (ref 31.5–35.7)
MCV: 79 fL (ref 79–97)
Monocytes Absolute: 0.6 10*3/uL (ref 0.1–0.9)
Monocytes: 6 %
Neutrophils Absolute: 6.2 10*3/uL (ref 1.4–7.0)
Neutrophils: 58 %
Platelets: 424 10*3/uL (ref 150–450)
RBC: 4.91 x10E6/uL (ref 3.77–5.28)
RDW: 13.4 % (ref 11.7–15.4)
WBC: 10.6 10*3/uL (ref 3.4–10.8)

## 2022-05-26 LAB — THYROID PANEL WITH TSH
Free Thyroxine Index: 2.8 (ref 1.2–4.9)
T3 Uptake Ratio: 25 % (ref 24–39)
T4, Total: 11 ug/dL (ref 4.5–12.0)
TSH: 1.2 u[IU]/mL (ref 0.450–4.500)

## 2022-05-26 LAB — HCV INTERPRETATION

## 2022-05-26 LAB — HEMOGLOBIN A1C
Est. average glucose Bld gHb Est-mCnc: 163 mg/dL
Hgb A1c MFr Bld: 7.3 % — ABNORMAL HIGH (ref 4.8–5.6)

## 2022-05-26 LAB — HCV AB W REFLEX TO QUANT PCR: HCV Ab: NONREACTIVE

## 2022-06-02 ENCOUNTER — Other Ambulatory Visit: Payer: Self-pay | Admitting: Nurse Practitioner

## 2022-06-02 MED ORDER — TRUEPLUS LANCETS 28G MISC
3 refills | Status: DC
  Filled 2022-06-02: qty 100, 50d supply, fill #0
  Filled 2022-12-19: qty 100, 50d supply, fill #1

## 2022-06-02 MED ORDER — TRUE METRIX BLOOD GLUCOSE TEST VI STRP
ORAL_STRIP | 12 refills | Status: DC
  Filled 2022-06-02: qty 100, 50d supply, fill #0
  Filled 2022-12-19: qty 100, 50d supply, fill #1

## 2022-06-02 MED ORDER — METFORMIN HCL 500 MG PO TABS
500.0000 mg | ORAL_TABLET | Freq: Every day | ORAL | 1 refills | Status: DC
  Filled 2022-06-02: qty 90, 90d supply, fill #0
  Filled 2022-12-19 – 2023-02-10 (×2): qty 90, 90d supply, fill #1

## 2022-06-02 MED ORDER — TRUE METRIX METER W/DEVICE KIT
PACK | 0 refills | Status: AC
  Filled 2022-06-02: qty 1, 30d supply, fill #0

## 2022-06-03 ENCOUNTER — Other Ambulatory Visit: Payer: Self-pay

## 2022-08-29 ENCOUNTER — Ambulatory Visit: Payer: Self-pay | Admitting: Nurse Practitioner

## 2022-12-19 ENCOUNTER — Other Ambulatory Visit: Payer: Self-pay

## 2022-12-26 ENCOUNTER — Other Ambulatory Visit: Payer: Self-pay

## 2023-01-04 ENCOUNTER — Ambulatory Visit: Payer: Medicaid Other | Admitting: Physician Assistant

## 2023-01-06 ENCOUNTER — Ambulatory Visit
Admission: RE | Admit: 2023-01-06 | Discharge: 2023-01-06 | Disposition: A | Discharge status: Home / Self Care | Payer: Medicaid Other | Source: Ambulatory Visit | Attending: Physician Assistant | Admitting: Physician Assistant

## 2023-01-06 VITALS — BP 109/76 | HR 76 | Temp 98.0°F | Resp 18

## 2023-01-06 DIAGNOSIS — K529 Noninfective gastroenteritis and colitis, unspecified: Secondary | ICD-10-CM

## 2023-01-06 MED ORDER — AMOXICILLIN-POT CLAVULANATE 875-125 MG PO TABS: 1.0000 | ORAL_TABLET | Freq: Two times a day (BID) | ORAL | 0 refills | Status: DC

## 2023-01-06 NOTE — ED Provider Notes (Signed)
[facility]    CSN: [id number] Arrival date & time: 01/06/23  1302      History   Chief Complaint Chief Complaint  Patient presents with   Abdominal Pain    Having bloody stools - Entered by patient    HPI Kara Booker is a 32 y.o. female.   Patient complains of rectal bleeding.  Patient reports she has had some abdominal cramping and discomfort for the past 5 days.  Patient has had multiple episodes of bloody diarrhea.  Patient reports she had 5 episodes yesterday.  She has not had any episodes today but she has cramping in her lower back and her lower abdomen.  Patient denies any fever or chills she has not had any vomiting.   Abdominal Pain   Past Medical History:  Diagnosis Date   Anemia    Gestational diabetes mellitus 04/28/2016   Gestational diabetes mellitus 04/28/2016   Kidney stones    Kidney stones    NVD (normal vaginal delivery) 01/17/2011   Pregnancy induced hypertension     Patient Active Problem List   Diagnosis Date Noted   Acute blood loss anemia 07/21/2016   GDM, class A2 07/16/2016   Gestational diabetes mellitus 04/28/2016   Supervision of other normal pregnancy, antepartum 02/02/2016   History of cesarean section 02/02/2016   Hx of preeclampsia, prior pregnancy, currently pregnant 02/02/2016   Overweight 03/27/2013   Acne 03/27/2013    Past Surgical History:  Procedure Laterality Date   CESAREAN SECTION     CESAREAN SECTION N/A 07/17/2016   Procedure: CESAREAN SECTION;  Surgeon: Lazaro Arms, MD;  Location: [facility] BIRTHING SUITES;  Service: Obstetrics;  Laterality: N/A;   KIDNEY STONE SURGERY      OB History     Gravida  4   Para  3   Term  3   Preterm      AB  1   Living  3      SAB  1   IAB      Ectopic      Multiple  0   Live Births  3            Home Medications    Prior to Admission medications   Medication Sig Start Date End Date Taking? Authorizing Provider  amoxicillin-clavulanate  (AUGMENTIN) 875-125 MG tablet Take 1 tablet by mouth 2 (two) times daily. 01/06/23  Yes Cheron Schaumann K, PA-C  Blood Glucose Monitoring Suppl (TRUE METRIX METER) w/Device KIT Use as instructed. Check blood glucose level by fingerstick twice per day. 06/02/22   Claiborne Rigg, NP  glucose blood (TRUE METRIX BLOOD GLUCOSE TEST) test strip Use as instructed. Check blood glucose level by fingerstick twice per day. 06/02/22   Claiborne Rigg, NP  metFORMIN (GLUCOPHAGE) 500 MG tablet Take 1 tablet (500 mg total) by mouth daily with breakfast. Or dinner 06/02/22   Claiborne Rigg, NP  TRUEplus Lancets 28G MISC Use as instructed. Check blood glucose level by fingerstick twice per day. 06/02/22   Claiborne Rigg, NP  ondansetron (ZOFRAN) 4 MG tablet Take 1 tablet (4 mg total) by mouth every 8 (eight) hours as needed for nausea or vomiting. 05/16/22   Ward, Tylene Fantasia, PA-C  topiramate (TOPAMAX) 50 MG tablet Take 1 tablet (50 mg total) by mouth at bedtime. For migraines 05/25/22   Claiborne Rigg, NP    Family History Family History  Problem Relation Age of Onset   Hyperlipidemia Maternal  Uncle    Diabetes Maternal Uncle    Cancer Maternal Uncle        LEUKEMIA   Hyperlipidemia Mother    Hyperlipidemia Father    Hyperlipidemia Paternal Uncle    Diabetes Paternal Uncle    Cancer Cousin     Social History Social History   Tobacco Use   Smoking status: Former    Packs/day: .1    Types: Cigarettes    Quit date: 12/16/2010    Years since quitting: 12.0   Smokeless tobacco: Never  Vaping Use   Vaping Use: Some days  Substance Use Topics   Alcohol use: No    Comment: occ   Drug use: No     Allergies   Patient has no known allergies.   Review of Systems Review of Systems  Gastrointestinal:  Positive for abdominal pain.  All other systems reviewed and are negative.    Physical Exam Triage Vital Signs ED Triage Vitals  Enc Vitals Group     BP 01/06/23 1359 109/76     Pulse  Rate 01/06/23 1359 76     Resp 01/06/23 1359 18     Temp 01/06/23 1359 98 F (36.7 C)     Temp Source 01/06/23 1359 Oral     SpO2 01/06/23 1359 95 %     Weight --      Height --      Head Circumference --      Peak Flow --      Pain Score 01/06/23 1356 5     Pain Loc --      Pain Edu? --      Excl. in GC? --    No data found.  Updated Vital Signs BP 109/76 (BP Location: Left Arm)   Pulse 76   Temp 98 F (36.7 C) (Oral)   Resp 18   LMP 12/30/2022 (Approximate)   SpO2 95%   Visual Acuity Right Eye Distance:   Left Eye Distance:   Bilateral Distance:    Right Eye Near:   Left Eye Near:    Bilateral Near:     Physical Exam Vitals and nursing note reviewed.  Constitutional:      Appearance: She is well-developed.  HENT:     Head: Normocephalic.  Cardiovascular:     Rate and Rhythm: Normal rate.  Pulmonary:     Effort: Pulmonary effort is normal.  Abdominal:     General: Bowel sounds are normal. There is no distension.     Palpations: Abdomen is soft.     Tenderness: There is no abdominal tenderness.  Genitourinary:    Rectum: No tenderness.  Musculoskeletal:        General: Normal range of motion.     Cervical back: Normal range of motion.  Skin:    General: Skin is warm.  Neurological:     General: No focal deficit present.     Mental Status: She is alert and oriented to person, place, and time.      UC Treatments / Results  Labs (all labs ordered are listed, but only abnormal results are displayed) Labs Reviewed - No data to display  EKG   Radiology No results found.  Procedures Procedures (including critical care time)  Medications Ordered in UC Medications - No data to display  Initial Impression / Assessment and Plan / UC Course  I have reviewed the triage vital signs and the nursing notes.  Pertinent labs & imaging results that were available  during my care of the patient were reviewed by me and considered in my medical decision  making (see chart for details).     DM: Patient does not have any signs of acute abdomen I suspect that she has an infectious diarrhea or possibly a colitis.  Patient is followed at the wellness clinic.  I will start the patient on Augmentin she is advised to schedule an appointment with wellness for recheck patient is advised she should schedule to see the GI doctor for evaluation.  Patient is advised if she has worsening pain fever vomiting or increasing symptoms she should go to the emergency department for further evaluation. Final Clinical Impressions(s) / UC Diagnoses   Final diagnoses:  Noninfectious gastroenteritis, unspecified type   Discharge Instructions   None    ED Prescriptions     Medication Sig Dispense Auth. Provider   amoxicillin-clavulanate (AUGMENTIN) 875-125 MG tablet Take 1 tablet by mouth 2 (two) times daily. 20 tablet Elson Areas, [place]      PDMP not reviewed this encounter. An After Visit Summary was printed and given to the patient.       Elson Areas, [place] 01/06/23 1458

## 2023-01-06 NOTE — ED Triage Notes (Signed)
Patient presents to [facility] for lower abdominal pain, nausea, and rectal bleeding since yesterday. States 2 weeks prior she had her first episodes of blood in stool. States she thought it was hemorrhoids but continues to have blood in stool. Describes it as bright red in color.  Denies constipation, diarrhea, no changes to diet or medications.

## 2023-01-24 NOTE — Progress Notes (Deleted)
Patient ID: Kara Booker, female   DOB: January 02, 1991, 32 y.o.   MRN: [id number]  After UC visit 01/06/2023 with GI virus Patient complains of rectal bleeding. Patient reports she has had some abdominal cramping and discomfort for the past 5 days. Patient has had multiple episodes of bloody diarrhea. Patient reports she had 5 episodes yesterday. She has not had any episodes today but she has cramping in her lower back and her lower abdomen. Patient denies any fever or chills she has not had any vomiting.   DM: Patient does not have any signs of acute abdomen I suspect that she has an infectious diarrhea or possibly a colitis. Patient is followed at the wellness clinic. I will start the patient on Augmentin she is advised to schedule an appointment with wellness for recheck patient is advised she should schedule to see the GI doctor for evaluation. Patient is advised if she has worsening pain fever vomiting or increasing symptoms she should go to the emergency department for further evaluation.

## 2023-01-25 ENCOUNTER — Encounter (INDEPENDENT_AMBULATORY_CARE_PROVIDER_SITE_OTHER): Payer: Self-pay

## 2023-01-25 ENCOUNTER — Ambulatory Visit (INDEPENDENT_AMBULATORY_CARE_PROVIDER_SITE_OTHER): Payer: Medicaid Other

## 2023-01-25 DIAGNOSIS — E1165 Type 2 diabetes mellitus with hyperglycemia: Secondary | ICD-10-CM

## 2023-02-07 ENCOUNTER — Ambulatory Visit: Payer: Medicaid Other | Attending: Nurse Practitioner | Admitting: Nurse Practitioner

## 2023-02-07 ENCOUNTER — Encounter: Payer: Self-pay | Admitting: Nurse Practitioner

## 2023-02-07 VITALS — BP 104/74 | HR 76 | Ht 61.0 in | Wt 221.8 lb

## 2023-02-07 DIAGNOSIS — Z1211 Encounter for screening for malignant neoplasm of colon: Secondary | ICD-10-CM | POA: Diagnosis not present

## 2023-02-07 DIAGNOSIS — K921 Melena: Secondary | ICD-10-CM

## 2023-02-07 NOTE — Progress Notes (Signed)
Assessment & Plan:  Kara Booker was seen today for rectal bleeding.  Diagnoses and all orders for this visit:  Bloody stools -     Fecal occult blood, imunochemical -     Ambulatory referral to Gastroenterology -     US Abdomen Complete; Future -     Giardia, EIA; Ova/Parasite  Colon cancer screening -     Fecal occult blood, imunochemical -     Ambulatory referral to Gastroenterology    Patient has been counseled on age-appropriate routine health concerns for screening and prevention. These are reviewed and up-to-date. Referrals have been placed accordingly. Immunizations are up-to-date or declined.    Subjective:   Chief Complaint  Patient presents with   Rectal Bleeding    Kara Booker 32 y.o. female presents to office today for follow up to bloody stools.   She was evaluated in the ED on 01-06-2023 with reports of abdominal cramping and discomfort over the past 5 days prior to being evaluated. Associated symptoms at that time also included cramping in the lower back and lower abdomen. It was felt at that time she was experiencing infectious diarrhea or colitis. PO Augmentin was started and she was instructed to follow up with PCP and GI upon discharge.   Today she reports her symptoms have been ongoing for the past several months. Usually will see blood in toilet or in stools. This is Occurring sporadically. Unrelated to dietary intake. Over the past several weeks has been occurring more frequently along with loose stools and blood clots. Last occurrence 2 days ago. Associated symptoms: right sided flank pain and discomfort. Denies hemorrhoids, N/V or fever. Denies abdominal pain or history of constipation.     Review of Systems  Constitutional:  Negative for fever, malaise/fatigue and weight loss.  HENT: Negative.  Negative for nosebleeds.   Eyes: Negative.  Negative for blurred vision, double vision and photophobia.  Respiratory: Negative.  Negative for cough and shortness  of breath.   Cardiovascular: Negative.  Negative for chest pain, palpitations and leg swelling.  Gastrointestinal:  Positive for blood in stool and diarrhea. Negative for abdominal pain, constipation, heartburn, melena, nausea and vomiting.  Musculoskeletal: Negative.  Negative for myalgias.  Neurological: Negative.  Negative for dizziness, focal weakness, seizures and headaches.  Psychiatric/Behavioral: Negative.  Negative for suicidal ideas.     Past Medical History:  Diagnosis Date   Anemia    Gestational diabetes mellitus 04/28/2016   Gestational diabetes mellitus 04/28/2016   Kidney stones    Kidney stones    NVD (normal vaginal delivery) 01/17/2011   Pregnancy induced hypertension     Past Surgical History:  Procedure Laterality Date   CESAREAN SECTION     CESAREAN SECTION N/A 07/17/2016   Procedure: CESAREAN SECTION;  Surgeon: Lazaro Arms, MD;  Location: [facility] BIRTHING SUITES;  Service: Obstetrics;  Laterality: N/A;   KIDNEY STONE SURGERY      Family History  Problem Relation Age of Onset   Hyperlipidemia Maternal Uncle    Diabetes Maternal Uncle    Cancer Maternal Uncle        LEUKEMIA   Hyperlipidemia Mother    Hyperlipidemia Father    Hyperlipidemia Paternal Uncle    Diabetes Paternal Uncle    Cancer Cousin     Social History Reviewed with no changes to be made today.   Outpatient Medications Prior to Visit  Medication Sig Dispense Refill   Blood Glucose Monitoring Suppl (TRUE METRIX METER) w/Device KIT Use as  instructed. Check blood glucose level by fingerstick twice per day. 1 kit 0   glucose blood (TRUE METRIX BLOOD GLUCOSE TEST) test strip Use as instructed. Check blood glucose level by fingerstick twice per day. 100 each 12   metFORMIN (GLUCOPHAGE) 500 MG tablet Take 1 tablet (500 mg total) by mouth daily with breakfast. Or dinner 90 tablet 1   TRUEplus Lancets 28G MISC Use as instructed. Check blood glucose level by fingerstick twice per day. 100 each 3    ondansetron (ZOFRAN) 4 MG tablet Take 1 tablet (4 mg total) by mouth every 8 (eight) hours as needed for nausea or vomiting. (Patient not taking: Reported on 02/07/2023) 20 tablet 0   topiramate (TOPAMAX) 50 MG tablet Take 1 tablet (50 mg total) by mouth at bedtime. For migraines (Patient not taking: Reported on 02/07/2023) 90 tablet 1   amoxicillin-clavulanate (AUGMENTIN) 875-125 MG tablet Take 1 tablet by mouth 2 (two) times daily. (Patient not taking: Reported on 02/07/2023) 20 tablet 0   No facility-administered medications prior to visit.    No Known Allergies     Objective:    BP 104/74 (BP Location: Left Arm, Patient Position: Sitting, Cuff Size: Normal)   Pulse 76   Ht 5\' 1"  (1.549 m)   Wt 221 lb 12.8 oz (100.6 kg)   LMP 02/05/2023 (Approximate)   SpO2 97%   BMI 41.91 kg/m  Wt Readings from Last 3 Encounters:  02/07/23 221 lb 12.8 oz (100.6 kg)  05/25/22 223 lb 6.4 oz (101.3 kg)  05/10/21 242 lb 8.1 oz (110 kg)    Physical Exam Vitals and nursing note reviewed.  Constitutional:      Appearance: She is well-developed.  HENT:     Head: Normocephalic and atraumatic.  Cardiovascular:     Rate and Rhythm: Normal rate and regular rhythm.     Heart sounds: Normal heart sounds. No murmur heard.    No friction rub. No gallop.  Pulmonary:     Effort: Pulmonary effort is normal. No tachypnea or respiratory distress.     Breath sounds: Normal breath sounds. No decreased breath sounds, wheezing, rhonchi or rales.  Chest:     Chest wall: No tenderness.  Abdominal:     General: Bowel sounds are normal.     Palpations: Abdomen is soft.  Musculoskeletal:        General: Normal range of motion.     Cervical back: Normal range of motion.  Skin:    General: Skin is warm and dry.  Neurological:     Mental Status: She is alert and oriented to person, place, and time.     Coordination: Coordination normal.  Psychiatric:        Behavior: Behavior normal. Behavior is cooperative.         Thought Content: Thought content normal.        Judgment: Judgment normal.          Patient has been counseled extensively about nutrition and exercise as well as the importance of adherence with medications and regular follow-up. The patient was given clear instructions to go to ER or return to medical center if symptoms don't improve, worsen or new problems develop. The patient verbalized understanding.   Follow-up: Return if symptoms worsen or fail to improve.   Claiborne Rigg, FNP-BC [facility] [place], [place] [phone]   02/07/2023, 10:53 AM

## 2023-02-10 ENCOUNTER — Other Ambulatory Visit: Payer: Self-pay

## 2023-02-10 ENCOUNTER — Encounter: Payer: Self-pay | Admitting: Gastroenterology

## 2023-02-14 ENCOUNTER — Telehealth: Payer: Self-pay | Admitting: Nurse Practitioner

## 2023-02-14 NOTE — Telephone Encounter (Signed)
Copied from CRM [id number]. Topic: General - Inquiry >> Feb 14, 2023 11:41 AM Patsy Lager T wrote: Reason for CRM: Ann from imaging called stated she has an order or an ultrasound of the abdomen for inflammatory bowel but they do not look at the bowel with the ultrasound. She is wanting to know if provider wants to change that to a CT Scan. Patient is due to come in tomorrow. Please f/u with Ann at [phone] or [phone] for ultrasound dept of 315

## 2023-02-14 NOTE — Telephone Encounter (Signed)
No changes. The diagnosis is abdominal pain

## 2023-02-15 ENCOUNTER — Ambulatory Visit
Admission: RE | Admit: 2023-02-15 | Discharge: 2023-02-15 | Disposition: A | Discharge status: Home / Self Care | Payer: Medicaid Other | Source: Ambulatory Visit | Attending: Nurse Practitioner | Admitting: Nurse Practitioner

## 2023-02-15 DIAGNOSIS — K921 Melena: Secondary | ICD-10-CM

## 2023-02-15 NOTE — Telephone Encounter (Signed)
Jennifer at [facility] is aware of the change in Dx.

## 2023-02-17 ENCOUNTER — Encounter: Payer: Self-pay | Admitting: Gastroenterology

## 2023-02-17 ENCOUNTER — Other Ambulatory Visit: Payer: Medicaid Other

## 2023-02-17 ENCOUNTER — Ambulatory Visit: Payer: Medicaid Other | Admitting: Gastroenterology

## 2023-02-17 ENCOUNTER — Other Ambulatory Visit: Payer: Self-pay

## 2023-02-17 VITALS — BP 110/64 | HR 82 | Ht 64.0 in | Wt 220.0 lb

## 2023-02-17 DIAGNOSIS — R748 Abnormal levels of other serum enzymes: Secondary | ICD-10-CM

## 2023-02-17 DIAGNOSIS — R194 Change in bowel habit: Secondary | ICD-10-CM | POA: Diagnosis not present

## 2023-02-17 DIAGNOSIS — R700 Elevated erythrocyte sedimentation rate: Secondary | ICD-10-CM

## 2023-02-17 DIAGNOSIS — R1084 Generalized abdominal pain: Secondary | ICD-10-CM

## 2023-02-17 DIAGNOSIS — K625 Hemorrhage of anus and rectum: Secondary | ICD-10-CM | POA: Diagnosis not present

## 2023-02-17 LAB — CBC WITH DIFFERENTIAL/PLATELET
Basophils Absolute: 0.1 10*3/uL (ref 0.0–0.1)
Basophils Relative: 0.9 % (ref 0.0–3.0)
Eosinophils Absolute: 0.3 10*3/uL (ref 0.0–0.7)
Eosinophils Relative: 3.2 % (ref 0.0–5.0)
HCT: 38 % (ref 36.0–46.0)
Hemoglobin: 12.2 g/dL (ref 12.0–15.0)
Lymphocytes Relative: 26.9 % (ref 12.0–46.0)
Lymphs Abs: 2.2 10*3/uL (ref 0.7–4.0)
MCHC: 32.1 g/dL (ref 30.0–36.0)
MCV: 76.7 fl — ABNORMAL LOW (ref 78.0–100.0)
Monocytes Absolute: 0.5 10*3/uL (ref 0.1–1.0)
Monocytes Relative: 5.8 % (ref 3.0–12.0)
Neutro Abs: 5.1 10*3/uL (ref 1.4–7.7)
Neutrophils Relative %: 63.2 % (ref 43.0–77.0)
Platelets: 454 10*3/uL — ABNORMAL HIGH (ref 150.0–400.0)
RBC: 4.95 Mil/uL (ref 3.87–5.11)
RDW: 14.7 % (ref 11.5–15.5)
WBC: 8.1 10*3/uL (ref 4.0–10.5)

## 2023-02-17 LAB — COMPREHENSIVE METABOLIC PANEL
ALT: 97 U/L — ABNORMAL HIGH (ref 0–35)
AST: 156 U/L — ABNORMAL HIGH (ref 0–37)
Albumin: 4.4 g/dL (ref 3.5–5.2)
Alkaline Phosphatase: 110 U/L (ref 39–117)
BUN: 12 mg/dL (ref 6–23)
CO2: 28 mEq/L (ref 19–32)
Calcium: 9.5 mg/dL (ref 8.4–10.5)
Chloride: 98 mEq/L (ref 96–112)
Creatinine, Ser: 0.5 mg/dL (ref 0.40–1.20)
GFR: 122.73 mL/min (ref >60.00)
Glucose, Bld: 170 mg/dL — ABNORMAL HIGH (ref 70–99)
Potassium: 3.9 mEq/L (ref 3.5–5.1)
Sodium: 134 mEq/L — ABNORMAL LOW (ref 135–145)
Total Bilirubin: 0.5 mg/dL (ref 0.2–1.2)
Total Protein: 8.2 g/dL (ref 6.0–8.3)

## 2023-02-17 LAB — C-REACTIVE PROTEIN: CRP: 1.5 mg/dL (ref 0.5–20.0)

## 2023-02-17 LAB — SEDIMENTATION RATE: Sed Rate: 63 mm/hr — ABNORMAL HIGH (ref 0–20)

## 2023-02-17 MED ORDER — NA SULFATE-K SULFATE-MG SULF 17.5-3.13-1.6 GM/177ML PO SOLN: 1.0000 | Freq: Once | ORAL | 0 refills | Status: AC

## 2023-02-17 MED ORDER — HYDROCORTISONE ACETATE 25 MG RE SUPP: 25.0000 mg | Freq: Two times a day (BID) | RECTAL | 1 refills | Status: DC

## 2023-02-17 NOTE — Patient Instructions (Signed)
Your provider has requested that you go to the basement level for lab work before leaving today. Press "B" on the elevator. The lab is located at the first door on the left as you exit the elevator.   We have sent the following medications to your pharmacy for you to pick up at your convenience: Hydrocortisone suppository nightly for 10 days.  You have been scheduled for a colonoscopy. Please follow written instructions given to you at your visit today.   Please pick up your prep supplies at the pharmacy within the next 1-3 days.  If you use inhalers (even only as needed), please bring them with you on the day of your procedure.  DO NOT TAKE 7 DAYS PRIOR TO TEST- Trulicity (dulaglutide) Ozempic, Wegovy (semaglutide) Mounjaro (tirzepatide) Bydureon Bcise (exanatide extended release)  DO NOT TAKE 1 DAY PRIOR TO YOUR TEST Rybelsus (semaglutide) Adlyxin (lixisenatide) Victoza (liraglutide) Byetta (exanatide) ___________________________________________________________________________  If your blood pressure at your visit was 140/90 or greater, please contact your primary care physician to follow up on this.  _______________________________________________________  If you are age 47 or older, your body mass index should be between 23-30. Your Body mass index is 37.76 kg/m. If this is out of the aforementioned range listed, please consider follow up with your Primary Care Provider.  If you are age 49 or younger, your body mass index should be between 19-25. Your Body mass index is 37.76 kg/m. If this is out of the aformentioned range listed, please consider follow up with your Primary Care Provider.   ________________________________________________________  The [facility] providers would like to encourage you to use [facility] to communicate with providers for non-urgent requests or questions.  Due to long hold times on the telephone, sending your provider a message by [facility] may be a  faster and more efficient way to get a response.  Please allow 48 business hours for a response.  Please remember that this is for non-urgent requests.  _______________________________________________________

## 2023-02-17 NOTE — Progress Notes (Signed)
02/17/2023 Kara Booker [id number] Jul 16, 1990   HISTORY OF PRESENT ILLNESS: This is a 32 year old female who is new to our office.  Has been referred here by her PCP, Bertram Denver, NP, for evaluation of bloody stools.  She says that initially she would just see bright red blood on the toilet paper upon wiping, but over the last couple of weeks she has had a lot more blood mixed in with her bowel movements and describes it as clots.  She says that it will happen for a few days and then not happen for couple days and then happens again.  She is also having abdominal cramping that she says is constant all the time.  Says she is not having any pain in her bottom and no constipation or straining.  She is actually had increased frequency of her stools 3-4 times a day.  TSH in November 2023 was normal, but she is not really had any regular labs performed since then.  She never had any issues similar to this in the past.  She does have a family history of some type of IBD in maternal grandmother and a maternal aunt.   Past Medical History:  Diagnosis Date   Anemia    Gestational diabetes mellitus 04/28/2016   Gestational diabetes mellitus 04/28/2016   Kidney stones    Kidney stones    NVD (normal vaginal delivery) 01/17/2011   Pregnancy induced hypertension    Past Surgical History:  Procedure Laterality Date   CESAREAN SECTION     CESAREAN SECTION N/A 07/17/2016   Procedure: CESAREAN SECTION;  Surgeon: Lazaro Arms, MD;  Location: [facility] BIRTHING SUITES;  Service: Obstetrics;  Laterality: N/A;   KIDNEY STONE SURGERY      reports that she quit smoking about 12 years ago. Her smoking use included cigarettes. She has never used smokeless tobacco. She reports that she does not drink alcohol and does not use drugs. family history includes Cancer in her cousin and maternal uncle; Diabetes in her maternal uncle and paternal uncle; Hyperlipidemia in her father, maternal uncle, mother, and paternal  uncle. No Known Allergies    Outpatient Encounter Medications as of 02/17/2023  Medication Sig   Blood Glucose Monitoring Suppl (TRUE METRIX METER) w/Device KIT Use as instructed. Check blood glucose level by fingerstick twice per day.   glucose blood (TRUE METRIX BLOOD GLUCOSE TEST) test strip Use as instructed. Check blood glucose level by fingerstick twice per day.   metFORMIN (GLUCOPHAGE) 500 MG tablet Take 1 tablet (500 mg total) by mouth daily with breakfast. Or dinner   TRUEplus Lancets 28G MISC Use as instructed. Check blood glucose level by fingerstick twice per day.   [DISCONTINUED] ondansetron (ZOFRAN) 4 MG tablet Take 1 tablet (4 mg total) by mouth every 8 (eight) hours as needed for nausea or vomiting. (Patient not taking: Reported on 02/07/2023)   [DISCONTINUED] topiramate (TOPAMAX) 50 MG tablet Take 1 tablet (50 mg total) by mouth at bedtime. For migraines (Patient not taking: Reported on 02/07/2023)   No facility-administered encounter medications on file as of 02/17/2023.     REVIEW OF SYSTEMS  : All other systems reviewed and negative except where noted in the History of Present Illness.   PHYSICAL EXAM: BP 110/64   Pulse 82   Ht 5\' 4"  (1.626 m)   Wt 220 lb (99.8 kg)   LMP 02/05/2023 (Approximate)   BMI 37.76 kg/m  General: Well developed female in no acute distress Head: Normocephalic  and atraumatic Eyes:  Sclerae anicteric, conjunctiva pink. Ears: Normal auditory acuity Lungs: Clear throughout to auscultation; no W/R/R. Heart: Regular rate and rhythm; no M/R/G. Abdomen: Soft, non-distended.  BS present.  LLQ TTP. Rectal:  Will be done at the time of colonoscopy. Musculoskeletal: Symmetrical with no gross deformities  Skin: No lesions on visible extremities Extremities: No edema  Neurological: Alert oriented x 4, grossly non-focal Psychological:  Alert and cooperative. Normal mood and affect  ASSESSMENT AND PLAN: *32 year old female with complaints of rectal  bleeding, initially small amounts upon wiping, but now clots in with bowel movements, etc. and change in bowel habits with loose stools and abdominal cramping.  Will check a CBC, CMP, sed rate, and CRP.  Will prescribe hydrocortisone suppositories for her to use at bedtime for the next week or so as if this is hemorrhoidal bleeding that may help.  Will also schedule for colonoscopy with Dr. Leonides Schanz.  The risks, benefits, and alternatives to colonoscopy were discussed with the patient and she consents to proceed.   CC:  Claiborne Rigg, NP

## 2023-02-20 ENCOUNTER — Other Ambulatory Visit (INDEPENDENT_AMBULATORY_CARE_PROVIDER_SITE_OTHER): Payer: Medicaid Other

## 2023-02-20 DIAGNOSIS — R748 Abnormal levels of other serum enzymes: Secondary | ICD-10-CM

## 2023-02-20 LAB — IBC + FERRITIN
Ferritin: 57 ng/mL (ref 10.0–291.0)
Iron: 65 ug/dL (ref 42–145)
Saturation Ratios: 13.5 % — ABNORMAL LOW (ref 20.0–50.0)
TIBC: 480.2 ug/dL — ABNORMAL HIGH (ref 250.0–450.0)
Transferrin: 343 mg/dL (ref 212.0–360.0)

## 2023-02-20 NOTE — Progress Notes (Signed)
I agree with the assessment and plan as outlined by Ms. Zehr. 

## 2023-02-23 LAB — IGA: Immunoglobulin A: 602 mg/dL — ABNORMAL HIGH (ref 47–310)

## 2023-02-23 LAB — ANTI-NUCLEAR AB-TITER (ANA TITER): ANA Titer 1: 1:80 {titer} — ABNORMAL HIGH

## 2023-02-23 LAB — HEPATITIS B SURFACE ANTIBODY,QUALITATIVE: Hep B S Ab: REACTIVE — AB

## 2023-02-23 LAB — ANA: Anti Nuclear Antibody (ANA): POSITIVE — AB

## 2023-02-23 LAB — TISSUE TRANSGLUTAMINASE, IGA: (tTG) Ab, IgA: <1 U/mL

## 2023-02-23 LAB — HEPATITIS A ANTIBODY, TOTAL: Hepatitis A AB,Total: REACTIVE — AB

## 2023-02-23 LAB — HEPATITIS B SURFACE ANTIGEN: Hepatitis B Surface Ag: NONREACTIVE

## 2023-02-23 LAB — IGG: IgG (Immunoglobin G), Serum: 1569 mg/dL (ref 600–1640)

## 2023-02-23 LAB — HEPATITIS C ANTIBODY: Hepatitis C Ab: NONREACTIVE

## 2023-02-23 LAB — ANTI-SMOOTH MUSCLE ANTIBODY, IGG: Actin (Smooth Muscle) Antibody (IGG): <20 U (ref <20)

## 2023-02-23 LAB — MITOCHONDRIAL ANTIBODIES: Mitochondrial M2 Ab, IgG: <=20 U (ref <=20.0)

## 2023-02-23 LAB — ALPHA-1-ANTITRYPSIN: A-1 Antitrypsin, Ser: 169 mg/dL (ref 83–199)

## 2023-02-23 LAB — CERULOPLASMIN: Ceruloplasmin: 36 mg/dL (ref 14–48)

## 2023-02-24 ENCOUNTER — Ambulatory Visit: Payer: Medicaid Other | Admitting: Internal Medicine

## 2023-02-24 ENCOUNTER — Encounter: Payer: Self-pay | Admitting: Internal Medicine

## 2023-02-24 VITALS — BP 106/70 | HR 68 | Temp 98.0°F | Resp 13 | Ht 64.0 in | Wt 220.0 lb

## 2023-02-24 DIAGNOSIS — K625 Hemorrhage of anus and rectum: Secondary | ICD-10-CM

## 2023-02-24 DIAGNOSIS — R1084 Generalized abdominal pain: Secondary | ICD-10-CM

## 2023-02-24 DIAGNOSIS — D122 Benign neoplasm of ascending colon: Secondary | ICD-10-CM

## 2023-02-24 DIAGNOSIS — R194 Change in bowel habit: Secondary | ICD-10-CM

## 2023-02-24 MED ORDER — SODIUM CHLORIDE 0.9 % IV SOLN: 500.0000 mL | Freq: Once | INTRAVENOUS | Status: DC

## 2023-02-24 NOTE — Progress Notes (Signed)
Sedate, gd SR, tolerated procedure well, VSS, report to RN 

## 2023-02-24 NOTE — Op Note (Signed)
[facility] Patient Name: Kara Booker Procedure Date: 02/24/2023 7:59 AM MRN: [id number] Endoscopist: Particia Lather , , [id number] Age: 32 Referring MD:  Date of Birth: 03-27-1991 Gender: Female Account #: [id number] Procedure:                Colonoscopy Indications:              Rectal bleeding Medicines:                Monitored Anesthesia Care Procedure:                Pre-Anesthesia Assessment:                           - Prior to the procedure, a History and Physical                            was performed, and patient medications and                            allergies were reviewed. The patient's tolerance of                            previous anesthesia was also reviewed. The risks                            and benefits of the procedure and the sedation                            options and risks were discussed with the patient.                            All questions were answered, and informed consent                            was obtained. Prior Anticoagulants: The patient has                            taken no anticoagulant or antiplatelet agents                            except for aspirin. ASA Grade Assessment: II - A                            patient with mild systemic disease. After reviewing                            the risks and benefits, the patient was deemed in                            satisfactory condition to undergo the procedure.                           After obtaining informed consent, the colonoscope  was passed under direct vision. Throughout the                            procedure, the patient's blood pressure, pulse, and                            oxygen saturations were monitored continuously. The                            Olympus Scope SN: [id number] was introduced through                            the anus and advanced to the the terminal ileum.                            The colonoscopy was  performed without difficulty.                            The patient tolerated the procedure well. The                            quality of the bowel preparation was good. The                            terminal ileum, ileocecal valve, appendiceal                            orifice, and rectum were photographed. Scope In: 8:11:04 AM Scope Out: 8:24:29 AM Scope Withdrawal Time: 0 hours 10 minutes 15 seconds  Total Procedure Duration: 0 hours 13 minutes 25 seconds  Findings:                 The terminal ileum appeared normal.                           Multiple diverticula were found in the entire colon.                           Three sessile polyps were found in the ascending                            colon. The polyps were 3 to 5 mm in size. These                            polyps were removed with a cold snare. Resection                            and retrieval were complete.                           Non-bleeding internal hemorrhoids were found during                            retroflexion. Complications:  No immediate complications. Estimated Blood Loss:     Estimated blood loss was minimal. Impression:               - The examined portion of the ileum was normal.                           - Diverticulosis in the entire examined colon.                           - Three 3 to 5 mm polyps in the ascending colon,                            removed with a cold snare. Resected and retrieved.                           - Non-bleeding internal hemorrhoids. Recommendation:           - Discharge patient to home (with escort).                           - Await pathology results.                           - Can consider hemorrhoidal banding in the future                            if patient has persistent bleeding.                           - Return to clinic in 6 months to follow up                            elevated LFTs.                           - The findings and  recommendations were discussed                            with the patient. Dr Particia Lather "Merrifield" Buena,  02/24/2023 8:32:01 AM

## 2023-02-24 NOTE — Progress Notes (Signed)
Pt's states no medical or surgical changes since previsit or office visit. 

## 2023-02-24 NOTE — Patient Instructions (Addendum)
Recommendation: - Discharge patient to home (with escort).                           - Await pathology results.                           - Can consider hemorrhoidal banding in the future                            if patient has persistent bleeding.                           - Return to clinic in 6 months to follow up                            elevated LFTs.                           - The findings and recommendations were discussed                            with the patient.  Handouts on diverticulosis, hemorrhoids/banding and polyps given,  Call office to schedule 6 month appointment, as schedule does currently not go out that far.  YOU HAD AN ENDOSCOPIC PROCEDURE TODAY AT THE [facility]:   Refer to the procedure report that was given to you for any specific questions about what was found during the examination.  If the procedure report does not answer your questions, please call your gastroenterologist to clarify.  If you requested that your care partner not be given the details of your procedure findings, then the procedure report has been included in a sealed envelope for you to review at your convenience later.  YOU SHOULD EXPECT: Some feelings of bloating in the abdomen. Passage of more gas than usual.  Walking can help get rid of the air that was put into your GI tract during the procedure and reduce the bloating. If you had a lower endoscopy (such as a colonoscopy or flexible sigmoidoscopy) you may notice spotting of blood in your stool or on the toilet paper. If you underwent a bowel prep for your procedure, you may not have a normal bowel movement for a few days.  Please Note:  You might notice some irritation and congestion in your nose or some drainage.  This is from the oxygen used during your procedure.  There is no need for concern and it should clear up in a day or so.  SYMPTOMS TO REPORT IMMEDIATELY:  Following lower endoscopy (colonoscopy or flexible  sigmoidoscopy):  Excessive amounts of blood in the stool  Significant tenderness or worsening of abdominal pains  Swelling of the abdomen that is new, acute  Fever of 100F or higher  For urgent or emergent issues, a gastroenterologist can be reached at any hour by calling [phone]. Do not use MyChart messaging for urgent concerns.    DIET:  We do recommend a small meal at first, but then you may proceed to your regular diet.  Drink plenty of fluids but you should avoid alcoholic beverages for 24 hours.  ACTIVITY:  You should plan to take it easy for the rest of today and you  should NOT DRIVE or use heavy machinery until tomorrow (because of the sedation medicines used during the test).    FOLLOW UP: Our staff will call the number listed on your records the next business day following your procedure.  We will call around 7:15- 8:00 am to check on you and address any questions or concerns that you may have regarding the information given to you following your procedure. If we do not reach you, we will leave a message.     If any biopsies were taken you will be contacted by phone or by letter within the next 1-3 weeks.  Please call us at [phone] if you have not heard about the biopsies in 3 weeks.    SIGNATURES/CONFIDENTIALITY: You and/or your care partner have signed paperwork which will be entered into your electronic medical record.  These signatures attest to the fact that that the information above on your After Visit Summary has been reviewed and is understood.  Full responsibility of the confidentiality of this discharge information lies with you and/or your care-partner.

## 2023-02-24 NOTE — Progress Notes (Signed)
GASTROENTEROLOGY PROCEDURE H&P NOTE   Primary Care Physician: Claiborne Rigg, NP    Reason for Procedure:   Rectal bleeding  Plan:    Colonoscopy  Patient is appropriate for endoscopic procedure(s) in the ambulatory ([facility]) setting.  The nature of the procedure, as well as the risks, benefits, and alternatives were carefully and thoroughly reviewed with the patient. Ample time for discussion and questions allowed. The patient understood, was satisfied, and agreed to proceed.     HPI: Kara Booker is a 32 y.o. female who presents for colonoscopy for evaluation of rectal bleeding .  Patient was most recently seen in the Gastroenterology Clinic on 02/17/23.  No interval change in medical history since that appointment. Please refer to that note for full details regarding GI history and clinical presentation.   Past Medical History:  Diagnosis Date   Anemia    Gestational diabetes mellitus 04/28/2016   Gestational diabetes mellitus 04/28/2016   Kidney stones    Kidney stones    NVD (normal vaginal delivery) 01/17/2011   Pregnancy induced hypertension     Past Surgical History:  Procedure Laterality Date   CESAREAN SECTION     CESAREAN SECTION N/A 07/17/2016   Procedure: CESAREAN SECTION;  Surgeon: Lazaro Arms, MD;  Location: [facility] BIRTHING SUITES;  Service: Obstetrics;  Laterality: N/A;   KIDNEY STONE SURGERY      Prior to Admission medications   Medication Sig Start Date End Date Taking? Authorizing Provider  Blood Glucose Monitoring Suppl (TRUE METRIX METER) w/Device KIT Use as instructed. Check blood glucose level by fingerstick twice per day. 06/02/22  Yes Claiborne Rigg, NP  glucose blood (TRUE METRIX BLOOD GLUCOSE TEST) test strip Use as instructed. Check blood glucose level by fingerstick twice per day. 06/02/22  Yes Claiborne Rigg, NP  hydrocortisone (ANUSOL-HC) 25 MG suppository Place 1 suppository (25 mg total) rectally every 12 (twelve) hours. 02/17/23  Yes  Zehr, Princella Pellegrini, PA-C  metFORMIN (GLUCOPHAGE) 500 MG tablet Take 1 tablet (500 mg total) by mouth daily with breakfast. Or dinner 06/02/22  Yes Claiborne Rigg, NP  Na Sulfate-K Sulfate-Mg Sulf 17.5-3.13-1.6 GM/177ML SOLN See admin instructions. 02/17/23  Yes [provider]  TRUEplus Lancets 28G MISC Use as instructed. Check blood glucose level by fingerstick twice per day. 06/02/22  Yes Claiborne Rigg, NP    Current Outpatient Medications  Medication Sig Dispense Refill   Blood Glucose Monitoring Suppl (TRUE METRIX METER) w/Device KIT Use as instructed. Check blood glucose level by fingerstick twice per day. 1 kit 0   glucose blood (TRUE METRIX BLOOD GLUCOSE TEST) test strip Use as instructed. Check blood glucose level by fingerstick twice per day. 100 each 12   hydrocortisone (ANUSOL-HC) 25 MG suppository Place 1 suppository (25 mg total) rectally every 12 (twelve) hours. 12 suppository 1   metFORMIN (GLUCOPHAGE) 500 MG tablet Take 1 tablet (500 mg total) by mouth daily with breakfast. Or dinner 90 tablet 1   Na Sulfate-K Sulfate-Mg Sulf 17.5-3.13-1.6 GM/177ML SOLN See admin instructions.     TRUEplus Lancets 28G MISC Use as instructed. Check blood glucose level by fingerstick twice per day. 100 each 3   Current Facility-Administered Medications  Medication Dose Route Frequency Provider Last Rate Last Admin   0.9 %  sodium chloride infusion  500 mL Intravenous Once Imogene Burn, MD        Allergies as of 02/24/2023   (No Known Allergies)    Family History  Problem Relation Age of Onset   Hyperlipidemia Mother    Hyperlipidemia Father    Hyperlipidemia Maternal Uncle    Diabetes Maternal Uncle    Cancer Maternal Uncle        LEUKEMIA   Hyperlipidemia Paternal Uncle    Diabetes Paternal Uncle    Cancer Cousin    Colon cancer Neg Hx    Stomach cancer Neg Hx    Esophageal cancer Neg Hx     Social History   Socioeconomic History   Marital status: Single     Spouse name: Not on file   Number of children: Not on file   Years of education: Not on file   Highest education level: 11th grade  Occupational History   Not on file  Tobacco Use   Smoking status: Former    Current packs/day: 0.00    Types: Cigarettes    Quit date: 12/16/2010    Years since quitting: 12.2   Smokeless tobacco: Never  Vaping Use   Vaping status: Some Days  Substance and Sexual Activity   Alcohol use: No    Comment: occ   Drug use: No   Sexual activity: Yes  Other Topics Concern   Not on file  Social History Narrative   Not on file   Social Determinants of Health   Financial Resource Strain: High Risk (01/24/2023)   Overall Financial Resource Strain (CARDIA)    Difficulty of Paying Living Expenses: Hard  Food Insecurity: Food Insecurity Present (01/24/2023)   Hunger Vital Sign    Worried About Running Out of Food in the Last Year: Sometimes true    Ran Out of Food in the Last Year: Never true  Transportation Needs: Unmet Transportation Needs (01/24/2023)   PRAPARE - Transportation    Lack of Transportation (Medical): Yes    Lack of Transportation (Non-Medical): Yes  Physical Activity: Sufficiently Active (01/24/2023)   Exercise Vital Sign    Days of Exercise per Week: 4 days    Minutes of Exercise per Session: 150+ min  Stress: Stress Concern Present (01/24/2023)   [employer] of Occupational Health - Occupational Stress Questionnaire    Feeling of Stress : To some extent  Social Connections: Moderately Isolated (01/24/2023)   Social Connection and Isolation Panel [NHANES]    Frequency of Communication with Friends and Family: More than three times a week    Frequency of Social Gatherings with Friends and Family: More than three times a week    Attends Religious Services: More than 4 times per year    Active Member of [employer] or Organizations: No    Attends Engineer, structural: Not on file    Marital Status: Never married  Intimate Partner  Violence: Not on file    Physical Exam: Vital signs in last 24 hours: BP (!) 124/56   Pulse 78   Temp 98 F (36.7 C) (Temporal)   Ht 5\' 4"  (1.626 m)   Wt 220 lb (99.8 kg)   LMP 02/05/2023 (Approximate)   SpO2 96%   BMI 37.76 kg/m  GEN: NAD EYE: Sclerae anicteric ENT: MMM CV: Non-tachycardic Pulm: No increased WOB GI: Soft NEURO:  Alert & Oriented   Eulah Pont, MD [facility]   02/24/2023 8:09 AM

## 2023-02-24 NOTE — Progress Notes (Signed)
Called to room to assist during endoscopic procedure.  Patient ID and intended procedure confirmed with present staff. Received instructions for my participation in the procedure from the performing physician.  

## 2023-02-27 ENCOUNTER — Telehealth: Payer: Self-pay

## 2023-02-27 NOTE — Telephone Encounter (Signed)
  Follow up Call-     02/24/2023    7:24 AM  Call back number  Post procedure Call Back phone  # [phone]  Permission to leave phone message Yes     Patient questions:  Do you have a fever, pain , or abdominal swelling? No. Pain Score  0 *  Have you tolerated food without any problems? Yes.    Have you been able to return to your normal activities? Yes.    Do you have any questions about your discharge instructions: Diet   No. Medications  No. Follow up visit  No.  Do you have questions or concerns about your Care? No.  Actions: * If pain score is 4 or above: No action needed, pain <4.

## 2023-02-28 ENCOUNTER — Encounter: Payer: Self-pay | Admitting: Internal Medicine

## 2023-07-27 ENCOUNTER — Encounter: Payer: Self-pay | Admitting: Internal Medicine

## 2023-09-27 ENCOUNTER — Ambulatory Visit: Admitting: Nurse Practitioner

## 2023-11-02 ENCOUNTER — Ambulatory Visit
Admission: RE | Admit: 2023-11-02 | Discharge: 2023-11-02 | Disposition: A | Discharge status: Home / Self Care | Source: Ambulatory Visit | Attending: Physician Assistant | Admitting: Physician Assistant

## 2023-11-02 ENCOUNTER — Other Ambulatory Visit: Payer: Self-pay

## 2023-11-02 VITALS — BP 139/80 | HR 95 | Temp 97.9°F | Resp 18

## 2023-11-02 DIAGNOSIS — R42 Dizziness and giddiness: Secondary | ICD-10-CM | POA: Diagnosis not present

## 2023-11-02 DIAGNOSIS — G43909 Migraine, unspecified, not intractable, without status migrainosus: Secondary | ICD-10-CM

## 2023-11-02 MED ORDER — MECLIZINE HCL 25 MG PO TABS: 25.0000 mg | ORAL_TABLET | Freq: Three times a day (TID) | ORAL | 0 refills | Status: DC | PRN

## 2023-11-02 MED ORDER — ONDANSETRON HCL 4 MG PO TABS: 4.0000 mg | ORAL_TABLET | Freq: Three times a day (TID) | ORAL | Status: DC | PRN

## 2023-11-02 NOTE — ED Triage Notes (Signed)
 Pt reports headache and nausea since Tuesday with dizziness when she turns her head. Vomited x 1 on Tuesday. She has taken ibuprofen  and OTC med for nausea. Denies fever

## 2023-11-02 NOTE — ED Provider Notes (Signed)
 [facility]    CSN: [id number] Arrival date & time: 11/02/23  1806      History   Chief Complaint Chief Complaint  Patient presents with   Dizziness    Headaches Nausea - Entered by patient   Nausea    HPI Kara Booker is a 33 y.o. female.   Patient here today for evaluation of dizziness and nausea with headache that started a few days ago.  She reports that symptoms have gradually improved but she continues to have some dizziness.  She notes her headaches are around a 5-6 on a 0-10 pain scale.  They are typical for her migraines and she has been using Tylenol  and ibuprofen  with mild relief for same.  She reports that she has not had any vision changes.  She notes that movement of her head seems to cause more dizziness and nausea.  She does not report any fever.  She is a diabetic and last glucose earlier today was a little over 100.  The history is provided by the patient.  Dizziness Associated symptoms: headaches and nausea   Associated symptoms: no shortness of breath and no vomiting     Past Medical History:  Diagnosis Date   Anemia    Gestational diabetes mellitus 04/28/2016   Gestational diabetes mellitus 04/28/2016   Kidney stones    Kidney stones    NVD (normal vaginal delivery) 01/17/2011   Pregnancy induced hypertension     Patient Active Problem List   Diagnosis Date Noted   Rectal bleeding 02/17/2023   Change in bowel habits 02/17/2023   Generalized abdominal pain 02/17/2023   Acute blood loss anemia 07/21/2016   GDM, class A2 07/16/2016   Gestational diabetes mellitus 04/28/2016   Supervision of other normal pregnancy, antepartum 02/02/2016   History of cesarean section 02/02/2016   Hx of preeclampsia, prior pregnancy, currently pregnant 02/02/2016   Overweight 03/27/2013   Acne 03/27/2013    Past Surgical History:  Procedure Laterality Date   CESAREAN SECTION     CESAREAN SECTION N/A 07/17/2016   Procedure: CESAREAN SECTION;   Surgeon: Wendelyn Halter, MD;  Location: [facility]IRTHING SU[facility];  Service: Obstetrics;  Laterality: N/A;   KIDNEY STONE SURGERY      OB History     Gravida  4   Para  3   Term  3   Preterm      AB  1   Living  3      SAB  1   IAB      Ectopic      Multiple  0   Live Births  3            Home Medications    Prior to Admission medications   Medication Sig Start Date End Date Taking? Authorizing Provider  Blood Glucose Monitoring Suppl (TRUE METRIX METER) w/Device KIT Use as instructed. Check blood glucose level by fingerstick twice per day. 06/02/22  Yes Fleming, Zelda W, NP  glucose blood (TRUE METRIX BLOOD GLUCOSE TEST) test strip Use as instructed. Check blood glucose level by fingerstick twice per day. 06/02/22  Yes Fleming, Zelda W, NP  meclizine  (ANTIVERT ) 25 MG tablet Take 1 tablet (25 mg total) by mouth 3 (three) times daily as needed for dizziness. 11/02/23  Yes Vernestine Gondola, PA-C  ondansetron  (ZOFRAN ) 4 MG tablet Take 1 tablet (4 mg total) by mouth every 8 (eight) hours as needed for nausea or vomiting. 11/02/23  Yes Vernestine Gondola,  PA-C  TRUEplus Lancets 28G MISC Use as instructed. Check blood glucose level by fingerstick twice per day. 06/02/22  Yes Fleming, Zelda W, NP  hydrocortisone  (ANUSOL -HC) 25 MG suppository Place 1 suppository (25 mg total) rectally every 12 (twelve) hours. 02/17/23   Zehr, Jessica D, PA-C  metFORMIN  (GLUCOPHAGE ) 500 MG tablet Take 1 tablet (500 mg total) by mouth daily with breakfast. Or dinner Patient not taking: Reported on 11/02/2023 06/02/22   Collins Dean, NP  Na Sulfate-K Sulfate-Mg Sulf 17.5-3.13-1.6 GM/177ML SOLN See admin instructions. 02/17/23   [provider]    Family History Family History  Problem Relation Age of Onset   Hyperlipidemia Mother    Hyperlipidemia Father    Hyperlipidemia Maternal Uncle    Diabetes Maternal Uncle    Cancer Maternal Uncle        LEUKEMIA   Hyperlipidemia Paternal Uncle     Diabetes Paternal Uncle    Cancer Cousin    Colon cancer Neg Hx    Stomach cancer Neg Hx    Esophageal cancer Neg Hx     Social History Social History   Tobacco Use   Smoking status: Former    Current packs/day: 0.00    Types: Cigarettes    Quit date: 12/16/2010    Years since quitting: 12.8   Smokeless tobacco: Never  Vaping Use   Vaping status: Some Days  Substance Use Topics   Alcohol use: No    Comment: occ   Drug use: No     Allergies   Patient has no known allergies.   Review of Systems Review of Systems  Constitutional:  Negative for chills and fever.  Eyes:  Negative for discharge and redness.  Respiratory:  Negative for shortness of breath.   Gastrointestinal:  Positive for nausea. Negative for abdominal pain and vomiting.  Neurological:  Positive for dizziness and headaches.     Physical Exam Triage Vital Signs ED Triage Vitals  Encounter Vitals Group     BP 11/02/23 1820 139/80     Systolic BP Percentile --      Diastolic BP Percentile --      Pulse Rate 11/02/23 1820 95     Resp 11/02/23 1820 18     Temp 11/02/23 1820 97.9 F (36.6 C)     Temp Source 11/02/23 1820 Oral     SpO2 11/02/23 1820 95 %     Weight --      Height --      Head Circumference --      Peak Flow --      Pain Score 11/02/23 1817 6     Pain Loc --      Pain Education --      Exclude from Growth Chart --    No data found.  Updated Vital Signs BP 139/80 (BP Location: Left Arm)   Pulse 95   Temp 97.9 F (36.6 C) (Oral)   Resp 18   LMP 10/08/2023   SpO2 95%   Visual Acuity Right Eye Distance:   Left Eye Distance:   Bilateral Distance:    Right Eye Near:   Left Eye Near:    Bilateral Near:     Physical Exam Vitals and nursing note reviewed.  Constitutional:      General: She is not in acute distress.    Appearance: Normal appearance. She is not ill-appearing.  HENT:     Head: Normocephalic and atraumatic.  Eyes:     Extraocular Movements:  Extraocular  movements intact.     Conjunctiva/sclera: Conjunctivae normal.     Pupils: Pupils are equal, round, and reactive to light.     Comments: Movement of eyes to lateral extremes causes some dizziness per patient.  Cardiovascular:     Rate and Rhythm: Normal rate and regular rhythm.  Pulmonary:     Effort: Pulmonary effort is normal. No respiratory distress.     Breath sounds: No wheezing, rhonchi or rales.  Neurological:     Mental Status: She is alert.     Comments: No facial droop, normal speech  Psychiatric:        Mood and Affect: Mood normal.        Behavior: Behavior normal.        Thought Content: Thought content normal.      UC Treatments / Results  Labs (all labs ordered are listed, but only abnormal results are displayed) Labs Reviewed - No data to display  EKG   Radiology No results found.  Procedures Procedures (including critical care time)  Medications Ordered in UC Medications - No data to display  Initial Impression / Assessment and Plan / UC Course  I have reviewed the triage vital signs and the nursing notes.  Pertinent labs & imaging results that were available during my care of the patient were reviewed by me and considered in my medical decision making (see chart for details).    Dizziness consistent with vertigo.  Will treat with meclizine .  Advised her to continue her over-the-counter treatment that she typically uses for migraine and recommended Zofran  for nausea.  Encouraged follow-up if no gradual improvement of symptoms or that she report to the emergency room with any worsening.  Patient expressed understanding.  Final Clinical Impressions(s) / UC Diagnoses   Final diagnoses:  Vertigo  Migraine without status migrainosus, not intractable, unspecified migraine type   Discharge Instructions   None    ED Prescriptions     Medication Sig Dispense Auth. Provider   meclizine  (ANTIVERT ) 25 MG tablet Take 1 tablet (25 mg total) by mouth 3  (three) times daily as needed for dizziness. 30 tablet Jami Mcclintock F, PA-C   ondansetron  (ZOFRAN ) 4 MG tablet Take 1 tablet (4 mg total) by mouth every 8 (eight) hours as needed for nausea or vomiting. -- Vernestine Gondola, PA-C      PDMP not reviewed this encounter.   Vernestine Gondola, PA-C 11/02/23 [phone]

## 2023-12-11 ENCOUNTER — Ambulatory Visit: Admitting: Nurse Practitioner

## 2024-03-31 ENCOUNTER — Ambulatory Visit: Admission: EM | Admit: 2024-03-31 | Discharge: 2024-03-31 | Disposition: A | Discharge status: Home / Self Care

## 2024-03-31 DIAGNOSIS — R1032 Left lower quadrant pain: Secondary | ICD-10-CM

## 2024-03-31 NOTE — ED Notes (Signed)
 Patient is being discharged from the Urgent Care and sent to the Emergency Department via private vehicle . Per Kara Aguas, NP, patient is in need of higher level of care due to abd pain. Patient is aware and verbalizes understanding of plan of care.  Vitals:   03/31/24 1358  BP: 120/83  Pulse: 86  Resp: 18  Temp: 98 F (36.7 C)  SpO2: 96%

## 2024-03-31 NOTE — ED Triage Notes (Addendum)
 Pt present abdomen pain on her left side. Symptoms started three days. Pt denies symptoms of nausea,diarrhea and vomiting

## 2024-03-31 NOTE — Discharge Instructions (Addendum)
  1. Abdominal pain, left lower quadrant (Primary) - Due to current symptoms of left lower quadrant abdominal pain x 3 days with pain with direct palpation of the area, follow-up in emergency department for further evaluation and treatment is recommended. - To fully evaluate your symptoms and determine the cause advanced imaging, stat laboratory testing and medications that are unavailable in urgent care may be required. - Please go to the emergency department after leaving urgent care for further evaluation and treatment.

## 2024-03-31 NOTE — ED Provider Notes (Cosign Needed Addendum)
 [facility]  Note:  This document was prepared using Conservation officer, historic buildings and may include unintentional dictation errors.  MRN: [id number] DOB: [birth year]  Subjective:   Kara Booker is a 33 y.o. female presenting for left-sided abdominal pain x 3 days.  Patient reports that she has history of umbilical hernia but states the symptoms seem somewhat different.  Patient is having pain and feels a lump in her left lower abdomen.  Patient has pain with direct palpation to the area.  No nausea/vomiting, diarrhea, fever, body aches.  Patient denies any known trauma or injury.  No current facility-administered medications for this encounter.  Current Outpatient Medications:    Blood Glucose Monitoring Suppl (TRUE METRIX METER) w/Device KIT, Use as instructed. Check blood glucose level by fingerstick twice per day., Disp: 1 kit, Rfl: 0   glucose blood (TRUE METRIX BLOOD GLUCOSE TEST) test strip, Use as instructed. Check blood glucose level by fingerstick twice per day., Disp: 100 each, Rfl: 12   hydrocortisone  (ANUSOL -HC) 25 MG suppository, Place 1 suppository (25 mg total) rectally every 12 (twelve) hours., Disp: 12 suppository, Rfl: 1   meclizine  (ANTIVERT ) 25 MG tablet, Take 1 tablet (25 mg total) by mouth 3 (three) times daily as needed for dizziness., Disp: 30 tablet, Rfl: 0   metFORMIN  (GLUCOPHAGE ) 500 MG tablet, Take 1 tablet (500 mg total) by mouth daily with breakfast. Or dinner (Patient not taking: Reported on 11/02/2023), Disp: 90 tablet, Rfl: 1   Na Sulfate-K Sulfate-Mg Sulf 17.5-3.13-1.6 GM/177ML SOLN, See admin instructions., Disp: , Rfl:    ondansetron  (ZOFRAN ) 4 MG tablet, Take 1 tablet (4 mg total) by mouth every 8 (eight) hours as needed for nausea or vomiting., Disp: , Rfl:    TRUEplus Lancets 28G MISC, Use as instructed. Check blood glucose level by fingerstick twice per day., Disp: 100 each, Rfl: 3   No Known Allergies  Past Medical History:  Diagnosis  Date   Anemia    Gestational diabetes mellitus 04/28/2016   Gestational diabetes mellitus 04/28/2016   Kidney stones    Kidney stones    NVD (normal vaginal delivery) 01/17/2011   Pregnancy induced hypertension      Past Surgical History:  Procedure Laterality Date   CESAREAN SECTION     CESAREAN SECTION N/A 07/17/2016   Procedure: CESAREAN SECTION;  Surgeon: Vonn VEAR Inch, MD;  Location: [facility] BIRTHING SUITES;  Service: Obstetrics;  Laterality: N/A;   KIDNEY STONE SURGERY      Family History  Problem Relation Age of Onset   Hyperlipidemia Mother    Hyperlipidemia Father    Hyperlipidemia Maternal Uncle    Diabetes Maternal Uncle    Cancer Maternal Uncle        LEUKEMIA   Hyperlipidemia Paternal Uncle    Diabetes Paternal Uncle    Cancer Cousin    Colon cancer Neg Hx    Stomach cancer Neg Hx    Esophageal cancer Neg Hx     Social History   Tobacco Use   Smoking status: Former    Current packs/day: 0.00    Types: Cigarettes    Quit date: 12/16/2010    Years since quitting: 13.2   Smokeless tobacco: Never  Vaping Use   Vaping status: Some Days  Substance Use Topics   Alcohol use: No    Comment: occ   Drug use: No    ROS Refer to HPI for ROS details.  Objective:   Vitals: BP 120/83 (BP Location: Left Arm)  Pulse 86   Temp 98 F (36.7 C) (Oral)   Resp 18   SpO2 96%   Physical Exam Vitals and nursing note reviewed.  Constitutional:      General: She is not in acute distress.    Appearance: Normal appearance. She is well-developed. She is not ill-appearing or toxic-appearing.  HENT:     Head: Normocephalic and atraumatic.     Mouth/Throat:     Mouth: Mucous membranes are moist.  Cardiovascular:     Rate and Rhythm: Normal rate.  Pulmonary:     Effort: Pulmonary effort is normal. No respiratory distress.  Abdominal:     General: There is no distension.     Palpations: Abdomen is soft.     Tenderness: There is abdominal tenderness in the left lower  quadrant. There is guarding and rebound. There is no right CVA tenderness or left CVA tenderness.     Hernia: A hernia is present. Hernia is present in the umbilical area.  Musculoskeletal:        General: Normal range of motion.  Skin:    General: Skin is warm and dry.  Neurological:     General: No focal deficit present.     Mental Status: She is alert and oriented to person, place, and time.  Psychiatric:        Mood and Affect: Mood normal.        Behavior: Behavior normal.     Procedures  No results found for this or any previous visit (from the past 24 hours).  No results found.   Assessment and Plan :     Discharge Instructions       1. Abdominal pain, left lower quadrant (Primary) - Due to current symptoms of left lower quadrant abdominal pain x 3 days with pain with direct palpation of the area, follow-up in emergency department for further evaluation and treatment is recommended. - To fully evaluate your symptoms and determine the cause advanced imaging, stat laboratory testing and medications that are unavailable in urgent care may be required. - Please go to the emergency department after leaving urgent care for further evaluation and treatment.       Cherice Glennie B Areal Cochrane   Leilana Mcquire, [place] B, NP 03/31/24 1451    Aurea Goodell B, NP 03/31/24 1452

## 2024-04-01 ENCOUNTER — Encounter (HOSPITAL_COMMUNITY): Payer: Self-pay

## 2024-04-01 ENCOUNTER — Emergency Department (HOSPITAL_COMMUNITY)

## 2024-04-01 ENCOUNTER — Emergency Department (HOSPITAL_COMMUNITY)
Admission: EM | Admit: 2024-04-01 | Discharge: 2024-04-01 | Disposition: A | Discharge status: Home / Self Care | Attending: Emergency Medicine | Admitting: Emergency Medicine

## 2024-04-01 DIAGNOSIS — R109 Unspecified abdominal pain: Secondary | ICD-10-CM | POA: Diagnosis present

## 2024-04-01 LAB — HCG, SERUM, QUALITATIVE: Preg, Serum: NEGATIVE

## 2024-04-01 LAB — URINALYSIS, ROUTINE W REFLEX MICROSCOPIC
Bilirubin Urine: NEGATIVE
Glucose, UA: 50 mg/dL — AB
Hgb urine dipstick: NEGATIVE
Ketones, ur: NEGATIVE mg/dL
Nitrite: POSITIVE — AB
Protein, ur: NEGATIVE mg/dL
Specific Gravity, Urine: 1.017 (ref 1.005–1.030)
pH: 6 (ref 5.0–8.0)

## 2024-04-01 LAB — COMPREHENSIVE METABOLIC PANEL WITH GFR
ALT: 79 U/L — ABNORMAL HIGH (ref 0–44)
AST: 75 U/L — ABNORMAL HIGH (ref 15–41)
Albumin: 4.4 g/dL (ref 3.5–5.0)
Alkaline Phosphatase: 142 U/L — ABNORMAL HIGH (ref 38–126)
Anion gap: 13 (ref 5–15)
BUN: 9 mg/dL (ref 6–20)
CO2: 24 mmol/L (ref 22–32)
Calcium: 9.8 mg/dL (ref 8.9–10.3)
Chloride: 100 mmol/L (ref 98–111)
Creatinine, Ser: 0.43 mg/dL — ABNORMAL LOW (ref 0.44–1.00)
GFR, Estimated: >60 mL/min (ref >60)
Glucose, Bld: 187 mg/dL — ABNORMAL HIGH (ref 70–99)
Potassium: 4 mmol/L (ref 3.5–5.1)
Sodium: 136 mmol/L (ref 135–145)
Total Bilirubin: 0.5 mg/dL (ref 0.0–1.2)
Total Protein: 8.1 g/dL (ref 6.5–8.1)

## 2024-04-01 LAB — CBC
HCT: 36.9 % (ref 36.0–46.0)
Hemoglobin: 11.8 g/dL — ABNORMAL LOW (ref 12.0–15.0)
MCH: 25.1 pg — ABNORMAL LOW (ref 26.0–34.0)
MCHC: 32 g/dL (ref 30.0–36.0)
MCV: 78.5 fL — ABNORMAL LOW (ref 80.0–100.0)
Platelets: 413 K/uL — ABNORMAL HIGH (ref 150–400)
RBC: 4.7 MIL/uL (ref 3.87–5.11)
RDW: 13.2 % (ref 11.5–15.5)
WBC: 11.5 K/uL — ABNORMAL HIGH (ref 4.0–10.5)
nRBC: 0 % (ref 0.0–0.2)

## 2024-04-01 LAB — LIPASE, BLOOD: Lipase: 19 U/L (ref 11–51)

## 2024-04-01 MED ORDER — SODIUM CHLORIDE 0.9 % IV BOLUS
1000.0000 mL | Freq: Once | INTRAVENOUS | Status: AC
  Administered 2024-04-01: 1000 mL via INTRAVENOUS

## 2024-04-01 MED ORDER — IOHEXOL 300 MG/ML  SOLN
100.0000 mL | Freq: Once | INTRAMUSCULAR | Status: AC | PRN
  Administered 2024-04-01: 100 mL via INTRAVENOUS

## 2024-04-01 MED ORDER — KETOROLAC TROMETHAMINE 30 MG/ML IJ SOLN
15.0000 mg | Freq: Once | INTRAMUSCULAR | Status: AC
  Administered 2024-04-01: 15 mg via INTRAVENOUS
  Filled 2024-04-01: qty 1

## 2024-04-01 NOTE — Discharge Instructions (Addendum)
 Return for any problem.   As discussed, the CT demonstrated some small amount of induration in the soft tissue of your left flank.  This is most consistent with a small contusion to the area.  Use Tylenol , ibuprofen , ice packs as  instructed for pain.  Return for increased pain, fever,or rash/redness.

## 2024-04-01 NOTE — ED Provider Notes (Signed)
 [facility] EMERGENCY DEPARTMENT AT [facility] Provider Note   CSN: [id number] Arrival date & time: 04/01/24  9045     Patient presents with: Abdominal Pain   Kara Booker is a 33 y.o. female.   33 year old female with prior medical history as detailed below presents for evaluation.  Patient complains of of constant left flank pain.  Patient's pain began 4 days ago.  Patient denies nausea or vomiting.  She denies fever.  She reports pain has been constant.  She denies anterior abdominal pain.  She denies difficulty urinating.  She denies change in bowel movement.  She was seen by urgent care yesterday for same complaint.  She was advised to come to the ED for further evaluation and imaging.  The history is provided by the patient and medical records.       Prior to Admission medications   Medication Sig Start Date End Date Taking? Authorizing Provider  Blood Glucose Monitoring Suppl (TRUE METRIX METER) w/Device KIT Use as instructed. Check blood glucose level by fingerstick twice per day. 06/02/22   Fleming, Zelda W, NP  glucose blood (TRUE METRIX BLOOD GLUCOSE TEST) test strip Use as instructed. Check blood glucose level by fingerstick twice per day. 06/02/22   Fleming, Zelda W, NP  hydrocortisone  (ANUSOL -HC) 25 MG suppository Place 1 suppository (25 mg total) rectally every 12 (twelve) hours. 02/17/23   Zehr, Jessica D, PA-C  meclizine  (ANTIVERT ) 25 MG tablet Take 1 tablet (25 mg total) by mouth 3 (three) times daily as needed for dizziness. 11/02/23   Billy Asberry FALCON, PA-C  metFORMIN  (GLUCOPHAGE ) 500 MG tablet Take 1 tablet (500 mg total) by mouth daily with breakfast. Or dinner Patient not taking: Reported on 11/02/2023 06/02/22   Theotis Haze ORN, NP  Na Sulfate-K Sulfate-Mg Sulf 17.5-3.13-1.6 GM/177ML SOLN See admin instructions. 02/17/23   [provider]  ondansetron  (ZOFRAN ) 4 MG tablet Take 1 tablet (4 mg total) by mouth every 8 (eight) hours as needed for  nausea or vomiting. 11/02/23   Billy Asberry FALCON, PA-C  TRUEplus Lancets 28G MISC Use as instructed. Check blood glucose level by fingerstick twice per day. 06/02/22   Fleming, Zelda W, NP    Allergies: Patient has no known allergies.    Review of Systems  All other systems reviewed and are negative.   Updated Vital Signs BP (!) 137/94 (BP Location: Right Arm)   Pulse 89   Temp 98.3 F (36.8 C) (Oral)   Resp 17   SpO2 99%   Physical Exam Vitals and nursing note reviewed.  Constitutional:      General: She is not in acute distress.    Appearance: Normal appearance. She is well-developed.  HENT:     Head: Normocephalic and atraumatic.  Eyes:     Conjunctiva/sclera: Conjunctivae normal.     Pupils: Pupils are equal, round, and reactive to light.  Cardiovascular:     Rate and Rhythm: Normal rate and regular rhythm.     Heart sounds: Normal heart sounds.  Pulmonary:     Effort: Pulmonary effort is normal. No respiratory distress.     Breath sounds: Normal breath sounds.  Abdominal:     General: There is no distension.     Palpations: Abdomen is soft.     Tenderness: There is no abdominal tenderness.     Comments: Patient localizes her pain to the far left lateral flank.  She describes a discrete area of maximal tenderness measuring approximately 4 x 5  cm.  This is located along the mid axillary line along the left flank.  There is no overlying erythema or rash noted.  No CVA tenderness.  No anterior abdominal tenderness.  Musculoskeletal:        General: No deformity. Normal range of motion.     Cervical back: Normal range of motion and neck supple.  Skin:    General: Skin is warm and dry.  Neurological:     General: No focal deficit present.     Mental Status: She is alert and oriented to person, place, and time.     (all labs ordered are listed, but only abnormal results are displayed) Labs Reviewed  COMPREHENSIVE METABOLIC PANEL WITH GFR - Abnormal; Notable for the  following components:      Result Value   Glucose, Bld 187 (*)    Creatinine, Ser 0.43 (*)    AST 75 (*)    ALT 79 (*)    Alkaline Phosphatase 142 (*)    All other components within normal limits  CBC - Abnormal; Notable for the following components:   WBC 11.5 (*)    Hemoglobin 11.8 (*)    MCV 78.5 (*)    MCH 25.1 (*)    Platelets 413 (*)    All other components within normal limits  URINALYSIS, ROUTINE W REFLEX MICROSCOPIC - Abnormal; Notable for the following components:   APPearance HAZY (*)    Glucose, UA 50 (*)    Nitrite POSITIVE (*)    Leukocytes,Ua LARGE (*)    Bacteria, UA MANY (*)    All other components within normal limits  LIPASE, BLOOD  HCG, SERUM, QUALITATIVE    EKG: None  Radiology: No results found.   Procedures   Medications Ordered in the ED  sodium chloride  0.9 % bolus 1,000 mL (has no administration in time range)  ketorolac  (TORADOL ) 30 MG/ML injection 15 mg (has no administration in time range)                                    Medical Decision Making Patient presents with persistent left-sided flank pain x 4 days.  She denies any urinary symptoms or nausea or vomiting or fever.  Obtained labs are overall reassuring and without evidence of significant acute abnormality.  UA is somewhat concerning for early infection.  However, patient is with any symptoms suggestive of UTI.  Obtained CT is reassuringly without significant acute abnormality.  There is a small area of induration consistent with small contusion in the tissue of the left flank.  This correlates with the patient's area of maximal tenderness.  Patient reassured by findings on workup.  Importance of close follow-up is stressed.  Strict return precautions given and understood.  Amount and/or Complexity of Data Reviewed Labs: ordered. Radiology: ordered.  Risk Prescription drug management.        Final diagnoses:  Flank pain    ED Discharge Orders     None           Laurice Maude BROCKS, MD 04/01/24 1419

## 2024-04-01 NOTE — ED Triage Notes (Signed)
 Pt arrived via POV, c/o left sided flank pain. Denies any n/v or urinary sx. States she was seen at [facility] and recommended to come to ED for scan.

## 2024-04-03 LAB — URINE CULTURE: Culture: >=100000 — AB

## 2024-04-04 ENCOUNTER — Telehealth (HOSPITAL_BASED_OUTPATIENT_CLINIC_OR_DEPARTMENT_OTHER): Payer: Self-pay

## 2024-04-04 NOTE — Progress Notes (Signed)
 ED Antimicrobial Stewardship Positive Culture Follow Up   Amrutha Montelongo is an 33 y.o. female who presented to [facility] on 04/01/2024 with a chief complaint of  Chief Complaint  Patient presents with   Abdominal Pain    Recent Results (from the past 720 hours)  Urine Culture     Status: Abnormal   Collection Time: 04/01/24 10:07 AM   Specimen: Urine, Clean Catch  Result Value Ref Range Status   Specimen Description   Final    URINE, CLEAN CATCH Performed at [facility], [address] [place], [place] [address]    Special Requests   Final    NONE Performed at [facility], [address] [place], [place] [address]    Culture >=100,000 COLONIES/mL ESCHERICHIA COLI (A)  Final   Report Status 04/03/2024 FINAL  Final   Organism ID, Bacteria ESCHERICHIA COLI (A)  Final      Susceptibility   Escherichia coli - MIC*    AMPICILLIN  4 SENSITIVE Sensitive     ERTAPENEM <=0.12 SENSITIVE Sensitive     CEFTRIAXONE <=0.25 SENSITIVE Sensitive     CIPROFLOXACIN <=0.06 SENSITIVE Sensitive     GENTAMICIN  <=1 SENSITIVE Sensitive     NITROFURANTOIN  <=16 SENSITIVE Sensitive     TRIMETH/SULFA <=20 SENSITIVE Sensitive     AMPICILLIN /SULBACTAM <=2 SENSITIVE Sensitive     MEROPENEM <=0.25 SENSITIVE Sensitive     * >=100,000 COLONIES/mL ESCHERICHIA COLI    [x]  Patient discharged originally without antimicrobial agent and treatment is now indicated  ED Provider: Rocky Hamilton, PA-C   Tashima Scarpulla T Emmersyn Kratzke 04/04/2024, 9:28 AM Clinical Pharmacist Monday - Friday phone -  [phone] Saturday - Sunday phone - [phone]

## 2024-04-04 NOTE — Telephone Encounter (Signed)
 Post ED Visit - Positive Culture Follow-up  Culture report reviewed by antimicrobial stewardship pharmacist: [facility] []  Rankin Dee, Pharm.D. []  Venetia Gully, Pharm.D., BCPS AQ-ID []  Garrel Crews, Pharm.D., BCPS []  Almarie Lunger, [address].D., BCPS []  [place], [place].D., BCPS, AAHIVP []  Rosaline Bihari, Pharm.D., BCPS, AAHIVP []  Vernell Meier, PharmD, BCPS []  Latanya Hint, PharmD, BCPS []  Donald Medley, PharmD, BCPS []  Rocky Bold, PharmD []  Dorothyann Alert, PharmD, BCPS []  Morene Babe, PharmD  [facility] [x]  Damien Quiet, PharmD []  Romona Bliss, PharmD []  Dolphus Roller, PharmD []  Veva Seip, Rph []  Vernell Daunt) Leonce, PharmD []  Eva Allis, PharmD []  Rosaline Millet, PharmD []  Iantha Batch, PharmD []  Arvin Gauss, PharmD []  Wanda Hasting, PharmD []  Ronal Rav, PharmD []  Rocky Slade, PharmD []  Bard Jeans, PharmD   Positive urine culture Reviewed by ED provider - Rocky Hamilton, PA-C Asymptomatic, no treatment needed and no further patient follow-up is required at this time.  Kara Booker 04/04/2024, 1:33 PM

## 2024-04-24 ENCOUNTER — Ambulatory Visit: Admitting: Nurse Practitioner

## 2024-05-29 ENCOUNTER — Ambulatory Visit: Admitting: Nurse Practitioner

## 2024-06-11 ENCOUNTER — Ambulatory Visit: Attending: Nurse Practitioner | Admitting: Nurse Practitioner

## 2024-06-11 VITALS — BP 126/84 | HR 81 | Resp 19 | Ht 64.0 in | Wt 223.4 lb

## 2024-06-11 DIAGNOSIS — D649 Anemia, unspecified: Secondary | ICD-10-CM

## 2024-06-11 DIAGNOSIS — E119 Type 2 diabetes mellitus without complications: Secondary | ICD-10-CM

## 2024-06-11 DIAGNOSIS — K429 Umbilical hernia without obstruction or gangrene: Secondary | ICD-10-CM

## 2024-06-11 DIAGNOSIS — Z0000 Encounter for general adult medical examination without abnormal findings: Secondary | ICD-10-CM

## 2024-06-11 DIAGNOSIS — E7800 Pure hypercholesterolemia, unspecified: Secondary | ICD-10-CM

## 2024-06-11 DIAGNOSIS — Z91128 Patient's intentional underdosing of medication regimen for other reason: Secondary | ICD-10-CM | POA: Diagnosis not present

## 2024-06-11 DIAGNOSIS — Z23 Encounter for immunization: Secondary | ICD-10-CM | POA: Diagnosis not present

## 2024-06-11 MED ORDER — GLUCOSE BLOOD VI STRP: ORAL_STRIP | 12 refills | Status: AC

## 2024-06-11 MED ORDER — METFORMIN HCL 500 MG PO TABS: 500.0000 mg | ORAL_TABLET | Freq: Every day | ORAL | 1 refills | Status: DC

## 2024-06-11 MED ORDER — ACCU-CHEK SOFTCLIX LANCETS MISC: 1 refills | Status: AC

## 2024-06-11 MED ORDER — ACCU-CHEK GUIDE ME W/DEVICE KIT: PACK | 0 refills | Status: AC

## 2024-06-11 NOTE — Progress Notes (Signed)
 Assessment & Plan:  Kara Booker was seen today for annual exam.  Diagnoses and all orders for this visit:  Encounter for annual physical exam  Anemia, unspecified type -     CBC with Differential  Diabetes mellitus treated with oral medication (HCC) -     CMP14+EGFR -     Hemoglobin A1c -     metFORMIN  (GLUCOPHAGE ) 500 MG tablet; Take 1 tablet (500 mg total) by mouth daily with breakfast. Or dinner -     glucose blood test strip; Use as instructed. Check blood glucose level by fingerstick twice per day. -     Blood Glucose Monitoring Suppl (ACCU-CHEK GUIDE ME) w/Device KIT; Use as instructed. Check blood glucose level by fingerstick twice per day. -     Accu-Chek Softclix Lancets lancets; Use as instructed. Check blood glucose level by fingerstick twice per day. -     Microalbumin / creatinine urine ratio  Hypercholesterolemia -     Lipid panel  Need for influenza vaccination -     Flu vaccine trivalent PF, 6mos and older(Flulaval,Afluria,Fluarix,Fluzone)  Need for pneumococcal 20-valent conjugate vaccination -     Pneumococcal conjugate vaccine 20-valent  Umbilical hernia without obstruction and without gangrene -     Ambulatory referral to General Surgery    Patient has been counseled on age-appropriate routine health concerns for screening and prevention. These are reviewed and up-to-date. Referrals have been placed accordingly. Immunizations are up-to-date or declined.    Subjective:   Chief Complaint  Patient presents with   Annual Exam    Kara Booker 33 y.o. female presents to office today for annual physical exam  I have not seen her in this office since 2024. She has not been taking any diabetes medications.  She has a very small umbilical hernia approximately 1-1.5 cm in diameter that she reports has been present for years. She reports intermittent discomfort and pain in this area.     Review of Systems  Constitutional:  Negative for fever,  malaise/fatigue and weight loss.  HENT: Negative.  Negative for nosebleeds.   Eyes: Negative.  Negative for blurred vision, double vision and photophobia.  Respiratory: Negative.  Negative for cough and shortness of breath.   Cardiovascular: Negative.  Negative for chest pain, palpitations and leg swelling.  Gastrointestinal:  Negative for heartburn, nausea and vomiting.       Umbilical hernia  Musculoskeletal: Negative.  Negative for myalgias.  Neurological: Negative.  Negative for dizziness, focal weakness, seizures and headaches.  Psychiatric/Behavioral: Negative.  Negative for suicidal ideas.     Past Medical History:  Diagnosis Date   Anemia    GERD (gastroesophageal reflux disease)    Gestational diabetes mellitus 04/28/2016   Gestational diabetes mellitus 04/28/2016   Kidney stones    Kidney stones    NVD (normal vaginal delivery) 01/17/2011   Pregnancy induced hypertension     Past Surgical History:  Procedure Laterality Date   CESAREAN SECTION     CESAREAN SECTION N/A 07/17/2016   Procedure: CESAREAN SECTION;  Surgeon: Vonn VEAR Inch, MD;  Location: [facility] BIRTHING SUITES;  Service: Obstetrics;  Laterality: N/A;   CHOLECYSTECTOMY     KIDNEY STONE SURGERY      Family History  Problem Relation Age of Onset   Hyperlipidemia Mother    Hyperlipidemia Father    Hyperlipidemia Maternal Uncle    Diabetes Maternal Uncle    Cancer Maternal Uncle        LEUKEMIA   Hyperlipidemia Paternal  Uncle    Diabetes Paternal Uncle    Cancer Cousin    Colon cancer Neg Hx    Stomach cancer Neg Hx    Esophageal cancer Neg Hx     Social History Reviewed with no changes to be made today.   Outpatient Medications Prior to Visit  Medication Sig Dispense Refill   Blood Glucose Monitoring Suppl (TRUE METRIX METER) w/Device KIT Use as instructed. Check blood glucose level by fingerstick twice per day. (Patient not taking: Reported on 06/11/2024) 1 kit 0   TRUEplus Lancets 28G MISC Use as  instructed. Check blood glucose level by fingerstick twice per day. (Patient not taking: Reported on 06/11/2024) 100 each 3   glucose blood (TRUE METRIX BLOOD GLUCOSE TEST) test strip Use as instructed. Check blood glucose level by fingerstick twice per day. (Patient not taking: Reported on 06/11/2024) 100 each 12   hydrocortisone  (ANUSOL -HC) 25 MG suppository Place 1 suppository (25 mg total) rectally every 12 (twelve) hours. (Patient not taking: Reported on 06/11/2024) 12 suppository 1   meclizine  (ANTIVERT ) 25 MG tablet Take 1 tablet (25 mg total) by mouth 3 (three) times daily as needed for dizziness. (Patient not taking: Reported on 06/11/2024) 30 tablet 0   metFORMIN  (GLUCOPHAGE ) 500 MG tablet Take 1 tablet (500 mg total) by mouth daily with breakfast. Or dinner (Patient not taking: Reported on 06/11/2024) 90 tablet 1   Na Sulfate-K Sulfate-Mg Sulf 17.5-3.13-1.6 GM/177ML SOLN See admin instructions. (Patient not taking: Reported on 06/11/2024)     ondansetron  (ZOFRAN ) 4 MG tablet Take 1 tablet (4 mg total) by mouth every 8 (eight) hours as needed for nausea or vomiting. (Patient not taking: Reported on 06/11/2024)     No facility-administered medications prior to visit.    No Known Allergies     Objective:    BP 126/84 (BP Location: Left Arm, Patient Position: Sitting, Cuff Size: Normal)   Pulse 81   Resp 19   Ht 5' 4 (1.626 m)   Wt 223 lb 6.4 oz (101.3 kg)   LMP 06/10/2024 (Exact Date)   SpO2 100%   BMI 38.35 kg/m  Wt Readings from Last 3 Encounters:  06/11/24 223 lb 6.4 oz (101.3 kg)  02/24/23 220 lb (99.8 kg)  02/17/23 220 lb (99.8 kg)    Physical Exam Constitutional:      Appearance: She is well-developed.  HENT:     Head: Normocephalic and atraumatic.     Right Ear: Hearing, tympanic membrane, ear canal and external ear normal.     Left Ear: Hearing, tympanic membrane, ear canal and external ear normal.     Nose: Nose normal.     Right Turbinates: Not enlarged.     Left  Turbinates: Not enlarged.     Mouth/Throat:     Lips: Pink.     Mouth: Mucous membranes are moist.     Dentition: No dental tenderness, gingival swelling, dental abscesses or gum lesions.     Pharynx: No oropharyngeal exudate.  Eyes:     General: No scleral icterus.       Right eye: No discharge.     Extraocular Movements: Extraocular movements intact.     Conjunctiva/sclera: Conjunctivae normal.     Pupils: Pupils are equal, round, and reactive to light.  Neck:     Thyroid : No thyromegaly.     Trachea: No tracheal deviation.  Cardiovascular:     Rate and Rhythm: Normal rate and regular rhythm.     Heart sounds:  Normal heart sounds. No murmur heard.    No friction rub.  Pulmonary:     Effort: Pulmonary effort is normal. No accessory muscle usage or respiratory distress.     Breath sounds: Normal breath sounds. No decreased breath sounds, wheezing, rhonchi or rales.  Abdominal:     General: Bowel sounds are normal. There is no distension.     Palpations: Abdomen is soft. There is no mass.     Tenderness: There is no abdominal tenderness. There is no right CVA tenderness, left CVA tenderness, guarding or rebound.     Hernia: A hernia is present. Hernia is present in the umbilical area.  Musculoskeletal:        General: No tenderness or deformity. Normal range of motion.     Cervical back: Normal range of motion and neck supple.  Lymphadenopathy:     Cervical: No cervical adenopathy.  Skin:    General: Skin is warm and dry.     Findings: No erythema.  Neurological:     Mental Status: She is alert and oriented to person, place, and time.     Cranial Nerves: No cranial nerve deficit.     Motor: Motor function is intact.     Coordination: Coordination is intact. Coordination normal.     Gait: Gait is intact.     Deep Tendon Reflexes:     Reflex Scores:      Patellar reflexes are 1+ on the right side and 1+ on the left side. Psychiatric:        Attention and Perception:  Attention normal.        Mood and Affect: Mood normal.        Speech: Speech normal.        Behavior: Behavior normal.        Thought Content: Thought content normal.        Judgment: Judgment normal.          Patient has been counseled extensively about nutrition and exercise as well as the importance of adherence with medications and regular follow-up. The patient was given clear instructions to go to ER or return to medical center if symptoms don't improve, worsen or new problems develop. The patient verbalized understanding.   Follow-up: Return in about 6 weeks (around 07/23/2024) for meter check.   Haze LELON Servant, FNP-BC [facility] and [facility] [place], [place] [phone]   06/11/2024, 10:57 AM

## 2024-06-13 LAB — CMP14+EGFR
ALT: 139 IU/L — ABNORMAL HIGH (ref 0–32)
AST: 222 IU/L — ABNORMAL HIGH (ref 0–40)
Albumin: 4.5 g/dL (ref 3.9–4.9)
Alkaline Phosphatase: 143 IU/L — ABNORMAL HIGH (ref 41–116)
BUN/Creatinine Ratio: 26 — ABNORMAL HIGH (ref 9–23)
BUN: 12 mg/dL (ref 6–20)
Bilirubin Total: 0.4 mg/dL (ref 0.0–1.2)
CO2: 23 mmol/L (ref 20–29)
Calcium: 9.7 mg/dL (ref 8.7–10.2)
Chloride: 98 mmol/L (ref 96–106)
Creatinine, Ser: 0.47 mg/dL — ABNORMAL LOW (ref 0.57–1.00)
Globulin, Total: 3.6 g/dL (ref 1.5–4.5)
Glucose: 149 mg/dL — ABNORMAL HIGH (ref 70–99)
Potassium: 4.1 mmol/L (ref 3.5–5.2)
Sodium: 139 mmol/L (ref 134–144)
Total Protein: 8.1 g/dL (ref 6.0–8.5)
eGFR: 127 mL/min/1.73 (ref >59)

## 2024-06-13 LAB — CBC WITH DIFFERENTIAL/PLATELET
Basophils Absolute: 0.1 x10E3/uL (ref 0.0–0.2)
Basos: 1 %
EOS (ABSOLUTE): 0.2 x10E3/uL (ref 0.0–0.4)
Eos: 2 %
Hematocrit: 40.6 % (ref 34.0–46.6)
Hemoglobin: 13.1 g/dL (ref 11.1–15.9)
Immature Grans (Abs): 0.1 x10E3/uL (ref 0.0–0.1)
Immature Granulocytes: 1 %
Lymphocytes Absolute: 2.7 x10E3/uL (ref 0.7–3.1)
Lymphs: 28 %
MCH: 25.5 pg — ABNORMAL LOW (ref 26.6–33.0)
MCHC: 32.3 g/dL (ref 31.5–35.7)
MCV: 79 fL (ref 79–97)
Monocytes Absolute: 0.5 x10E3/uL (ref 0.1–0.9)
Monocytes: 5 %
Neutrophils Absolute: 6.4 x10E3/uL (ref 1.4–7.0)
Neutrophils: 63 %
Platelets: 386 x10E3/uL (ref 150–450)
RBC: 5.14 x10E6/uL (ref 3.77–5.28)
RDW: 14 % (ref 11.7–15.4)
WBC: 10 x10E3/uL (ref 3.4–10.8)

## 2024-06-13 LAB — LIPID PANEL
Chol/HDL Ratio: 4.2 ratio (ref 0.0–4.4)
Cholesterol, Total: 247 mg/dL — ABNORMAL HIGH (ref 100–199)
HDL: 59 mg/dL (ref >39)
LDL Chol Calc (NIH): 151 mg/dL — ABNORMAL HIGH (ref 0–99)
Triglycerides: 207 mg/dL — ABNORMAL HIGH (ref 0–149)
VLDL Cholesterol Cal: 37 mg/dL (ref 5–40)

## 2024-06-13 LAB — MICROALBUMIN / CREATININE URINE RATIO
Creatinine, Urine: 120.6 mg/dL
Microalb/Creat Ratio: 156 mg/g{creat} — ABNORMAL HIGH (ref 0–29)
Microalbumin, Urine: 188.6 ug/mL

## 2024-06-13 LAB — HEMOGLOBIN A1C
Est. average glucose Bld gHb Est-mCnc: 223 mg/dL
Hgb A1c MFr Bld: 9.4 % — ABNORMAL HIGH (ref 4.8–5.6)

## 2024-06-22 ENCOUNTER — Ambulatory Visit: Payer: Self-pay | Admitting: Nurse Practitioner

## 2024-06-22 DIAGNOSIS — R809 Proteinuria, unspecified: Secondary | ICD-10-CM

## 2024-06-22 DIAGNOSIS — R748 Abnormal levels of other serum enzymes: Secondary | ICD-10-CM

## 2024-06-22 DIAGNOSIS — E7800 Pure hypercholesterolemia, unspecified: Secondary | ICD-10-CM

## 2024-06-22 MED ORDER — DAPAGLIFLOZIN PROPANEDIOL 5 MG PO TABS: 5.0000 mg | ORAL_TABLET | Freq: Every day | ORAL | 3 refills | Status: DC

## 2024-06-22 MED ORDER — ATORVASTATIN CALCIUM 20 MG PO TABS: 20.0000 mg | ORAL_TABLET | Freq: Every day | ORAL | 3 refills | Status: DC

## 2024-06-25 ENCOUNTER — Other Ambulatory Visit: Payer: Self-pay

## 2024-06-25 ENCOUNTER — Other Ambulatory Visit: Payer: Self-pay | Admitting: Nurse Practitioner

## 2024-06-25 ENCOUNTER — Telehealth: Payer: Self-pay

## 2024-06-25 DIAGNOSIS — E119 Type 2 diabetes mellitus without complications: Secondary | ICD-10-CM

## 2024-06-25 MED ORDER — TIRZEPATIDE 2.5 MG/0.5ML ~~LOC~~ SOAJ
2.5000 mg | SUBCUTANEOUS | 1 refills | Status: DC
  Filled 2024-06-25: qty 2, 28d supply, fill #0

## 2024-06-25 NOTE — Telephone Encounter (Signed)
 Pharmacy Patient Advocate Encounter  Received notification from [facility] MEDICAID that Prior Authorization for FARXIGA  has been APPROVED from 06/25/2024 to 06/25/2025   PA #/Case ID/Reference #: [id number]   [facility] NOTIFIED AND MYCHART MESSAGE SENT TO PATIENT.

## 2024-06-26 ENCOUNTER — Other Ambulatory Visit: Payer: Self-pay

## 2024-07-02 ENCOUNTER — Other Ambulatory Visit: Payer: Self-pay

## 2024-07-22 ENCOUNTER — Telehealth: Payer: Self-pay | Admitting: Nurse Practitioner

## 2024-07-22 NOTE — Telephone Encounter (Signed)
 Contacted pt left vm to confirmed appt

## 2024-07-23 ENCOUNTER — Other Ambulatory Visit: Payer: Self-pay

## 2024-07-23 ENCOUNTER — Ambulatory Visit: Attending: Nurse Practitioner | Admitting: Nurse Practitioner

## 2024-07-23 ENCOUNTER — Encounter: Payer: Self-pay | Admitting: Nurse Practitioner

## 2024-07-23 VITALS — BP 110/72 | HR 76 | Ht 64.0 in | Wt 218.6 lb

## 2024-07-23 DIAGNOSIS — E7800 Pure hypercholesterolemia, unspecified: Secondary | ICD-10-CM

## 2024-07-23 DIAGNOSIS — R809 Proteinuria, unspecified: Secondary | ICD-10-CM

## 2024-07-23 DIAGNOSIS — R748 Abnormal levels of other serum enzymes: Secondary | ICD-10-CM

## 2024-07-23 DIAGNOSIS — E119 Type 2 diabetes mellitus without complications: Secondary | ICD-10-CM

## 2024-07-23 DIAGNOSIS — Z7984 Long term (current) use of oral hypoglycemic drugs: Secondary | ICD-10-CM | POA: Diagnosis not present

## 2024-07-23 MED ORDER — DAPAGLIFLOZIN PROPANEDIOL 10 MG PO TABS
10.0000 mg | ORAL_TABLET | Freq: Every day | ORAL | 1 refills | Status: AC
  Filled 2024-07-23: qty 90, 90d supply, fill #0

## 2024-07-23 MED ORDER — METFORMIN HCL 500 MG PO TABS
500.0000 mg | ORAL_TABLET | Freq: Every day | ORAL | 1 refills | Status: AC
  Filled 2024-07-23: qty 90, 90d supply, fill #0

## 2024-07-23 MED ORDER — OZEMPIC (0.25 OR 0.5 MG/DOSE) 2 MG/3ML ~~LOC~~ SOPN
0.2500 mg | PEN_INJECTOR | SUBCUTANEOUS | 1 refills | Status: AC
  Filled 2024-07-23: qty 3, 42d supply, fill #0

## 2024-07-23 MED ORDER — ATORVASTATIN CALCIUM 20 MG PO TABS
20.0000 mg | ORAL_TABLET | Freq: Every day | ORAL | 3 refills | Status: AC
  Filled 2024-07-23: qty 90, 90d supply, fill #0

## 2024-07-23 NOTE — Progress Notes (Signed)
 "  Assessment & Plan:  Kara Booker was seen today for medical management of chronic issues.  Diagnoses and all orders for this visit:  Diabetes mellitus treated with oral medication  Increase Farxiga  from 5 mg to 10 mg and start Ozempic  -     metFORMIN  (GLUCOPHAGE ) 500 MG tablet; Take 1 tablet (500 mg total) by mouth daily with breakfast. Or dinner -     Semaglutide ,0.25 or 0.5MG /DOS, (OZEMPIC , 0.25 OR 0.5 MG/DOSE,) 2 MG/3ML SOPN; Inject 0.25 mg into the skin once a week.  Microalbuminuria -     dapagliflozin  propanediol (FARXIGA ) 10 MG TABS tablet; Take 1 tablet (10 mg total) by mouth daily.  Hypercholesterolemia -     atorvastatin  (LIPITOR) 20 MG tablet; Take 1 tablet (20 mg total) by mouth daily. -     Lipid panel INSTRUCTIONS: Work on a low fat, heart healthy diet and participate in regular aerobic exercise program by working out at least 150 minutes per week; 5 days a week-30 minutes per day. Avoid red meat/beef/steak,  fried foods. junk foods, sodas, sugary drinks, unhealthy snacking, alcohol and smoking.  Drink at least 80 oz of water per day and monitor your carbohydrate intake daily.    Elevated liver enzymes -     CMP14+EGFR -     Acute Hep Panel & Hep B Surface Ab -     Lipase -     Amylase    Patient has been counseled on age-appropriate routine health concerns for screening and prevention. These are reviewed and up-to-date. Referrals have been placed accordingly. Immunizations are up-to-date or declined.    Subjective:   Chief Complaint  Patient presents with   Medical Management of Chronic Issues    Meter check.     Kara Booker 34 y.o. female presents to office today for meter check   Last recorded A1c was 9.4 in December.  She is currently taking metformin  500 mg daily (cannot tolerate a higher dose due to abdominal pain and diarrhea), Farxiga  5 mg daily. Meter averages as follows: 7 day 234 14 day 188 30 day 194 90 day 196 Lab Results  Component Value  Date   HGBA1C 9.4 (H) 06/11/2024  As meter averages are above goal we will increase Farxiga  and add Ozempic  today  Review of Systems  Constitutional:  Negative for fever, malaise/fatigue and weight loss.  HENT: Negative.  Negative for nosebleeds.   Eyes: Negative.  Negative for blurred vision, double vision and photophobia.  Respiratory: Negative.  Negative for cough and shortness of breath.   Cardiovascular: Negative.  Negative for chest pain, palpitations and leg swelling.  Gastrointestinal: Negative.  Negative for heartburn, nausea and vomiting.  Musculoskeletal: Negative.  Negative for myalgias.  Neurological: Negative.  Negative for dizziness, focal weakness, seizures and headaches.  Psychiatric/Behavioral: Negative.  Negative for suicidal ideas.     Past Medical History:  Diagnosis Date   Anemia    GERD (gastroesophageal reflux disease)    Gestational diabetes mellitus 04/28/2016   Gestational diabetes mellitus 04/28/2016   Kidney stones    Kidney stones    NVD (normal vaginal delivery) 01/17/2011   Pregnancy induced hypertension     Past Surgical History:  Procedure Laterality Date   CESAREAN SECTION     CESAREAN SECTION N/A 07/17/2016   Procedure: CESAREAN SECTION;  Surgeon: Vonn VEAR Inch, MD;  Location: [facility] SU[facility];  Service: Obstetrics;  Laterality: N/A;   CHOLECYSTECTOMY     KIDNEY STONE SURGERY  Family History  Problem Relation Age of Onset   Hyperlipidemia Mother    Hyperlipidemia Father    Hyperlipidemia Maternal Uncle    Diabetes Maternal Uncle    Cancer Maternal Uncle        LEUKEMIA   Hyperlipidemia Paternal Uncle    Diabetes Paternal Uncle    Cancer Cousin    Colon cancer Neg Hx    Stomach cancer Neg Hx    Esophageal cancer Neg Hx     Social History Reviewed with no changes to be made today.   Outpatient Medications Prior to Visit  Medication Sig Dispense Refill   Accu-Chek Softclix Lancets lancets Use as instructed. Check blood  glucose level by fingerstick twice per day. 200 each 1   Blood Glucose Monitoring Suppl (ACCU-CHEK GUIDE ME) w/Device KIT Use as instructed. Check blood glucose level by fingerstick twice per day. 1 kit 0   Blood Glucose Monitoring Suppl (TRUE METRIX METER) w/Device KIT Use as instructed. Check blood glucose level by fingerstick twice per day. 1 kit 0   glucose blood test strip Use as instructed. Check blood glucose level by fingerstick twice per day. 200 each 12   atorvastatin  (LIPITOR) 20 MG tablet Take 1 tablet (20 mg total) by mouth daily. 90 tablet 3   dapagliflozin  propanediol (FARXIGA ) 5 MG TABS tablet Take 1 tablet (5 mg total) by mouth daily. 90 tablet 3   metFORMIN  (GLUCOPHAGE ) 500 MG tablet Take 1 tablet (500 mg total) by mouth daily with breakfast. Or dinner 90 tablet 1   tirzepatide  (MOUNJARO ) 2.5 MG/0.5ML Pen Inject 2.5 mg into the skin once a week. (Patient not taking: Reported on 07/23/2024) 2 mL 1   No facility-administered medications prior to visit.    Allergies[1]     Objective:    BP 110/72 (BP Location: Left Arm, Patient Position: Sitting, Cuff Size: Normal)   Pulse 76   Ht 5' 4 (1.626 m)   Wt 218 lb 9.6 oz (99.2 kg)   LMP 06/11/2024 (Exact Date)   SpO2 100%   BMI 37.52 kg/m  Wt Readings from Last 3 Encounters:  07/23/24 218 lb 9.6 oz (99.2 kg)  06/11/24 223 lb 6.4 oz (101.3 kg)  02/24/23 220 lb (99.8 kg)    Physical Exam Vitals and nursing note reviewed.  Constitutional:      Appearance: She is well-developed. She is obese.  HENT:     Head: Normocephalic and atraumatic.  Cardiovascular:     Rate and Rhythm: Normal rate and regular rhythm.     Heart sounds: Normal heart sounds. No murmur heard.    No friction rub. No gallop.  Pulmonary:     Effort: Pulmonary effort is normal. No tachypnea or respiratory distress.     Breath sounds: Normal breath sounds. No decreased breath sounds, wheezing, rhonchi or rales.  Chest:     Chest wall: No tenderness.   Musculoskeletal:        General: Normal range of motion.     Cervical back: Normal range of motion.  Skin:    General: Skin is warm and dry.  Neurological:     Mental Status: She is alert and oriented to person, place, and time.     Coordination: Coordination normal.  Psychiatric:        Behavior: Behavior normal. Behavior is cooperative.        Thought Content: Thought content normal.        Judgment: Judgment normal.  Patient has been counseled extensively about nutrition and exercise as well as the importance of adherence with medications and regular follow-up. The patient was given clear instructions to go to ER or return to medical center if symptoms don't improve, worsen or new problems develop. The patient verbalized understanding.   Follow-up: Return in about 2 months (around 10/04/2024).   Haze LELON Servant, FNP-BC [facility] [place], [place] [phone]   07/23/2024, 12:32 PM     [1] No Known Allergies  "

## 2024-07-24 ENCOUNTER — Ambulatory Visit: Payer: Self-pay | Admitting: Nurse Practitioner

## 2024-07-24 ENCOUNTER — Other Ambulatory Visit: Payer: Self-pay

## 2024-07-24 LAB — ACUTE HEP PANEL AND HEP B SURFACE AB
Hep A IgM: NEGATIVE
Hep B C IgM: NEGATIVE
Hep C Virus Ab: NONREACTIVE
Hepatitis B Surf Ab Quant: 53.9 m[IU]/mL
Hepatitis B Surface Ag: NEGATIVE

## 2024-07-24 LAB — CMP14+EGFR
ALT: 107 IU/L — ABNORMAL HIGH (ref 0–32)
AST: 109 IU/L — ABNORMAL HIGH (ref 0–40)
Albumin: 4.5 g/dL (ref 3.9–4.9)
Alkaline Phosphatase: 140 IU/L — ABNORMAL HIGH (ref 41–116)
BUN/Creatinine Ratio: 23 (ref 9–23)
BUN: 12 mg/dL (ref 6–20)
Bilirubin Total: 0.4 mg/dL (ref 0.0–1.2)
CO2: 21 mmol/L (ref 20–29)
Calcium: 9.7 mg/dL (ref 8.7–10.2)
Chloride: 100 mmol/L (ref 96–106)
Creatinine, Ser: 0.53 mg/dL — ABNORMAL LOW (ref 0.57–1.00)
Globulin, Total: 3.3 g/dL (ref 1.5–4.5)
Glucose: 211 mg/dL — ABNORMAL HIGH (ref 70–99)
Potassium: 4.6 mmol/L (ref 3.5–5.2)
Sodium: 138 mmol/L (ref 134–144)
Total Protein: 7.8 g/dL (ref 6.0–8.5)
eGFR: 124 mL/min/1.73

## 2024-07-24 LAB — LIPID PANEL
Chol/HDL Ratio: 4.5 ratio — ABNORMAL HIGH (ref 0.0–4.4)
Cholesterol, Total: 236 mg/dL — ABNORMAL HIGH (ref 100–199)
HDL: 52 mg/dL
LDL Chol Calc (NIH): 142 mg/dL — ABNORMAL HIGH (ref 0–99)
Triglycerides: 231 mg/dL — ABNORMAL HIGH (ref 0–149)
VLDL Cholesterol Cal: 42 mg/dL — ABNORMAL HIGH (ref 5–40)

## 2024-07-24 LAB — LIPASE: Lipase: 25 U/L (ref 14–72)

## 2024-07-24 LAB — AMYLASE: Amylase: 29 U/L — ABNORMAL LOW (ref 31–110)

## 2024-07-29 ENCOUNTER — Other Ambulatory Visit: Payer: Self-pay

## 2024-07-29 ENCOUNTER — Telehealth: Payer: Self-pay

## 2024-07-29 NOTE — Telephone Encounter (Signed)
 Pharmacy Patient Advocate Encounter  Received notification from [facility] that Prior Authorization for OZEMPIC  has been APPROVED from 07/26/2024 to 07/26/2025

## 2024-08-07 ENCOUNTER — Other Ambulatory Visit: Payer: Self-pay

## 2024-10-16 ENCOUNTER — Ambulatory Visit: Payer: Self-pay | Admitting: Nurse Practitioner
